# Patient Record
Sex: Male | Born: 1995
Health system: Southern US, Community
[De-identification: ages and names within clinical notes are randomized; demographics above are authoritative.]

## PROBLEM LIST (undated history)

## (undated) DIAGNOSIS — F419 Anxiety disorder, unspecified: Secondary | ICD-10-CM

## (undated) DIAGNOSIS — F988 Other specified behavioral and emotional disorders with onset usually occurring in childhood and adolescence: Secondary | ICD-10-CM

## (undated) DIAGNOSIS — T7840XA Allergy, unspecified, initial encounter: Secondary | ICD-10-CM

## (undated) DIAGNOSIS — E119 Type 2 diabetes mellitus without complications: Secondary | ICD-10-CM

## (undated) DIAGNOSIS — J45909 Unspecified asthma, uncomplicated: Secondary | ICD-10-CM

## (undated) DIAGNOSIS — K219 Gastro-esophageal reflux disease without esophagitis: Secondary | ICD-10-CM

## (undated) DIAGNOSIS — L709 Acne, unspecified: Secondary | ICD-10-CM

## (undated) DIAGNOSIS — G4730 Sleep apnea, unspecified: Secondary | ICD-10-CM

## (undated) DIAGNOSIS — L0591 Pilonidal cyst without abscess: Secondary | ICD-10-CM

## (undated) DIAGNOSIS — I10 Essential (primary) hypertension: Secondary | ICD-10-CM

## (undated) HISTORY — DX: Allergy, unspecified, initial encounter: T78.40XA

## (undated) HISTORY — DX: Other specified behavioral and emotional disorders with onset usually occurring in childhood and adolescence: F98.8

## (undated) HISTORY — DX: Gastro-esophageal reflux disease without esophagitis: K21.9

## (undated) HISTORY — PX: OTHER SURGICAL HISTORY: SHX169

## (undated) HISTORY — DX: Unspecified asthma, uncomplicated: J45.909

## (undated) HISTORY — DX: Essential (primary) hypertension: I10

---
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
## 2015-01-03 ENCOUNTER — Ambulatory Visit (HOSPITAL_BASED_OUTPATIENT_CLINIC_OR_DEPARTMENT_OTHER): Payer: 59 | Attending: General Surgery | Admitting: Radiology

## 2015-01-03 VITALS — Ht 67.0 in | Wt 330.0 lb

## 2015-01-03 DIAGNOSIS — I10 Essential (primary) hypertension: Secondary | ICD-10-CM | POA: Insufficient documentation

## 2015-01-03 DIAGNOSIS — Z6841 Body Mass Index (BMI) 40.0 and over, adult: Secondary | ICD-10-CM | POA: Insufficient documentation

## 2015-01-03 DIAGNOSIS — R5383 Other fatigue: Secondary | ICD-10-CM | POA: Diagnosis present

## 2015-01-03 DIAGNOSIS — G4733 Obstructive sleep apnea (adult) (pediatric): Secondary | ICD-10-CM | POA: Insufficient documentation

## 2015-01-03 DIAGNOSIS — R0683 Snoring: Secondary | ICD-10-CM

## 2015-01-07 DIAGNOSIS — R0683 Snoring: Secondary | ICD-10-CM | POA: Diagnosis not present

## 2015-01-07 NOTE — Progress Notes (Signed)
  Patient Name: Charles Morton, Charles Morton Study Date: 01/03/2015 Gender: Male D.O.B: 11-27-1995 Age (years): 19 Referring Provider: Gaynelle AduEric [name] Height (inches): 67 Interpreting Physician: Jetty Duhamellinton Silvina Hackleman MD, ABSM Weight (lbs): 330 RPSGT: Ulyess MortSpruill, Vicki BMI: 52 MRN: [id number] Neck Size: 18.00  CLINICAL INFORMATION Sleep Study Type: NPSG Indication for sleep study: Daytime Fatigue, Hypertension, Morbid Obesity, Snoring Epworth Sleepiness Score: 18  SLEEP STUDY TECHNIQUE As per the AASM Manual for the Scoring of Sleep and Associated Events v2.3 (April 2016) with a hypopnea requiring 4% desaturations. The channels recorded and monitored were frontal, central and occipital EEG, electrooculogram (EOG), submentalis EMG (chin), nasal and oral airflow, thoracic and abdominal wall motion, anterior tibialis EMG, snore microphone, electrocardiogram, and pulse oximetry.  MEDICATIONS Patient's medications include: charted for review. Medications self-administered by patient during sleep study : No sleep medicine administered.  SLEEP ARCHITECTURE The study was initiated at 9:53:23 PM and ended at 4:40:53 AM. Sleep onset time was 40.9 minutes and the sleep efficiency was 63.1%. The total sleep time was 257.0 minutes. Stage REM latency was 295.0 minutes. The patient spent 16.93% of the night in stage N1 sleep, 45.53% in stage N2 sleep, 27.04% in stage N3 and 10.51% in REM. Alpha intrusion was absent. Supine sleep was 85.03%. Wake after sleep onset 109.6 minutes  RESPIRATORY PARAMETERS The overall apnea/hypopnea index (AHI) was 5.1 per hour. There were 1 total apneas, including 1 obstructive, 0 central and 0 mixed apneas. There were 21 hypopneas and 13 RERAs. The AHI during Stage REM sleep was 37.8 per hour. AHI while supine was 1.4 per hour. The mean oxygen saturation was 95.10%. The minimum SpO2 during sleep was 82.00%. Moderate snoring was noted during this study.  CARDIAC DATA The 2 lead EKG  demonstrated sinus rhythm. The mean heart rate was 77.21 beats per minute. Other EKG findings include: None.  LEG MOVEMENT DATA The total PLMS were 0 with a resulting PLMS index of 0.00. Associated arousal with leg movement index was 0.0 . IMPRESSIONS - Mild obstructive sleep apnea occurred during this study (AHI = 5.1/h). - No significant central sleep apnea occurred during this study (CAI = 0.0/h). - Mild oxygen desaturation was noted during this study (Min O2 = 82.00%). - The patient snored with Moderate snoring volume. - No cardiac abnormalities were noted during this study. - Clinically significant periodic limb movements did not occur during sleep. No significant associated arousals.  DIAGNOSIS - Obstructive Sleep Apnea (327.23 [G47.33 ICD-10])  RECOMMENDATIONS - Very mild obstructive sleep apnea. Return to discuss treatment options. - Avoid alcohol, sedatives and other CNS depressants that may worsen sleep apnea and disrupt normal sleep architecture. - Sleep hygiene should be reviewed to assess factors that may improve sleep quality. - Weight management and regular exercise should be initiated or continued if appropriate.  Waymon BudgeYOUNG,Charles Morton D Diplomate, American Board of Sleep Medicine  ELECTRONICALLY SIGNED ON:  01/07/2015, 12:07 PM [facility] PH: [phone]   FX: [phone] ACCREDITED BY THE AMERICAN ACADEMY OF SLEEP MEDICINE

## 2015-01-09 ENCOUNTER — Encounter: Payer: 59 | Attending: General Surgery

## 2015-01-09 DIAGNOSIS — Z713 Dietary counseling and surveillance: Secondary | ICD-10-CM | POA: Insufficient documentation

## 2015-01-09 DIAGNOSIS — Z6841 Body Mass Index (BMI) 40.0 and over, adult: Secondary | ICD-10-CM | POA: Diagnosis not present

## 2015-01-10 NOTE — Progress Notes (Signed)
  Pre-Operative Nutrition Class:  Appt start time: 4585   End time:  1830.  Patient was seen on 01/09/15 for Pre-Operative Bariatric Surgery Education at [facility].   Surgery date: 01/24/15 Surgery type: Sleeve gastrectomy [facility] weight at [facility]: 318 lbs on 10/08/14 Weight today: 333.5 lbs  TANITA  BODY COMP RESULTS  01/09/15   BMI (kg/m^2) 49.3   Fat Mass (lbs) 206   Fat Free Mass (lbs) 127.5   Total Body Water (lbs) 93.5   Samples given per MNT protocol. Patient educated on appropriate usage: Premier protein shake (vanilla - qty 1) Lot #: [id number] Exp: 12/2015  Unjury protein powder (unflavored - qty 1) Lot #: [id number] Exp: 09/2015  Celebrate Vitamins Multivitamin (orange - qty 1) Lot #: [id number] Exp: 05/2016  Celebrate Vitamins Calcium Citrate (berry - qty 1) Lot #: [id number] Exp: 05/2016  PB2 (chocolate - qty 1) Lot #: N/A Exp: 01/14/15  The following the learning objectives were met by the patient during this course:  Identify Pre-Op Dietary Goals and will begin 2 weeks pre-operatively  Identify appropriate sources of fluids and proteins   State protein recommendations and appropriate sources pre and post-operatively  Identify Post-Operative Dietary Goals and will follow for 2 weeks post-operatively  Identify appropriate multivitamin and calcium sources  Describe the need for physical activity post-operatively and will follow MD recommendations  State when to call healthcare provider regarding medication questions or post-operative complications  Handouts given during class include:  Pre-Op Bariatric Surgery Diet Handout  Protein Shake Handout  Post-Op Bariatric Surgery Nutrition Handout  [facility] Program Information Flyer  Support Group Information Flyer  [facility] Bariatric Supplements Price List  Follow-Up Plan: Patient will follow-up at [facility] 2 weeks post operatively for diet advancement per MD.

## 2015-01-12 ENCOUNTER — Ambulatory Visit: Payer: Self-pay | Admitting: General Surgery

## 2015-01-12 NOTE — H&P (Signed)
Charles Morton [birth year]  03/13/2014 4:07 PM Location: [facility] Patient #: [id number] DOB: [birth year] Single / Language: Charles Morton / Race: White Male  History of Present Illness Charles Morton M. Charles Likes MD; 01/12/2015 5:15 PM) Patient words: bariatric.  The patient is a 19 year old male who presents for a pre-op visit. He comes in today for his preoperative visit. He has been approved for laparoscopic sleeve gastrectomy with upper endoscopy. I initially met him on June 16. His weight at that time was 302 pounds. His weight today is 327 pounds. He started his preoperative diet last week. He states that he weighed 335 pounds when he started his preoperative diet. He is accompanied by his mother. He denies any changes to his medical history since we initially met. He denies any fever, chills, chest pain, shortness of breath, dyspnea on exertion, orthopnea, paroxysmal nocturnal dyspnea, abdominal pain, diarrhea or constipation. He states that his reflux is actually much improved.  His preoperative workup showed that he had a diffuse fatty liver on abdominal ultrasound without gallstones. His upper GI was unremarkable. His evaluation labs were unremarkable except for hemoglobin A1c level of 5.8. His LFTs were normal when we checked him. He has been seen by nutrition and psychology and cleared for surgery.   Problem List/Past Medical Charles Morton Charles Derby, MD; 01/12/2015 5:15 PM) MILD OBSTRUCTIVE SLEEP APNEA (G47.33) PREDIABETES (R73.03) ELEVATED BLOOD PRESSURE (I10) FATTY LIVER DISEASE, NONALCOHOLIC (N00.3) SNORING (R06.83) PILONIDAL ABSCESS (L05.01) OBESITY, MORBID, BMI 40.0-49.9 (E66.01) GASTROESOPHAGEAL REFLUX DISEASE, ESOPHAGITIS PRESENCE NOT SPECIFIED (K21.9)  Other Problems Charles Curry, MD; 01/12/2015 5:15 PM) Other disease, cancer, significant illness  Diagnostic Studies History Charles Curry, MD; 01/12/2015 5:15 PM) Colonoscopy never  Allergies ([facility]; 01/12/2015 4:08 PM) BuSpar *ANTIANXIETY AGENTS* Depakote *ANTICONVULSANTS* Intuniv *ADHD/ANTI-NARCOLEPSY/ANTI-OBESITY/ANOREXIANTS*  Medication History Charles Curry, MD; 01/12/2015 5:15 PM) PROzac (20MG  Capsule, Oral) Active. CloNIDine HCl (0.2MG  Tablet, Oral) Active. Medications Reconciled OxyCODONE HCl (5MG /5ML Solution, 5-10 Milliliter Oral every four hours, as needed, Taken starting 01/12/2015) Active. Protonix (20MG  Tablet DR, 1 (one) Tablet DR Oral daily, Taken starting 01/12/2015) Active. Zofran ODT (4MG  Tablet Disperse, 1 (one) Tablet Disperse Oral every four hours, as needed, Taken starting 01/12/2015) Active. ZyrTEC Childrens Allergy (10MG  Tablet Chewable, Oral) Active. Adderall XR (30MG  Capsule ER 24HR, Oral) Active.  Social History Charles Curry, MD; 01/12/2015 5:15 PM) No drug use No alcohol use Tobacco use Never smoker. Caffeine use Tea.  Family History Charles Curry, MD; 01/12/2015 5:15 PM) Arthritis Mother. Migraine Headache Mother.     Review of Systems Charles Morton M. Charles Delair MD; 01/12/2015 5:13 PM) General Present- Weight Gain. Not Present- Appetite Loss, Chills, Fatigue, Fever, Night Sweats and Weight Loss. Skin Not Present- Change in Wart/Mole, Dryness, Hives, Jaundice, New Lesions, Non-Healing Wounds, Rash and Ulcer. HEENT Not Present- Earache, Hearing Loss, Hoarseness, Nose Bleed, Oral Ulcers, Ringing in the Ears, Seasonal Allergies, Sinus Pain, Sore Throat, Visual Disturbances, Wears glasses/contact lenses and Yellow Eyes. Respiratory Not Present- Bloody sputum, Chronic Cough, Difficulty Breathing, Snoring and Wheezing. Breast Not Present- Breast Mass, Breast Pain, Nipple Discharge and Skin Changes. Cardiovascular Not Present- Chest Pain, Difficulty Breathing Lying Down, Leg Cramps, Palpitations, Rapid Heart Rate, Shortness of Breath and Swelling of Extremities. Gastrointestinal Not Present- Abdominal Pain, Bloating, Bloody Stool, Change in  Bowel Habits, Chronic diarrhea, Constipation, Difficulty Swallowing, Excessive gas, Gets full quickly at meals, Hemorrhoids, Indigestion, Nausea, Rectal Pain and Vomiting. Male Genitourinary Not Present- Blood in Urine, Change in Urinary Stream, Frequency, Impotence,  Nocturia, Painful Urination, Urgency and Urine Leakage. Neurological Not Present- Decreased Memory, Fainting, Headaches, Numbness, Seizures, Tingling, Tremor, Trouble walking and Weakness. Psychiatric Present- Anxiety. Not Present- Bipolar, Change in Sleep Pattern, Depression, Fearful and Frequent crying. Endocrine Not Present- Cold Intolerance, Excessive Hunger, Hair Changes, Heat Intolerance and New Diabetes. Hematology Not Present- Easy Bruising, Excessive bleeding, Gland problems, HIV and Persistent Infections.  Vitals (Charles Morton CMA; 01/12/2015 4:08 PM) 01/12/2015 4:08 PM Weight: 327 lb (Above 99th percentile) Height: 69in (42nd percentile) Body Surface Area: 2.55 m Body Mass Index: 48.29 kg/m  (Above 99th percentile)  Temp.: 59F(Temporal)  Pulse: 106 (Regular)  BP: 122/78 (Sitting, Left Arm, Standard)  Percentiles calculated using CDC data for children 2-20 years.    Physical Exam Charles Morton M. Charles Schermerhorn MD; 01/12/2015 5:13 PM)  General Mental Status-Alert. General Appearance-Consistent with stated age. Hydration-Well hydrated. Voice-Normal. Note: morbidly obese  Head and Neck Head-normocephalic, atraumatic with no lesions or palpable masses. Trachea-midline. Thyroid Gland Characteristics - normal size and consistency.  Eye Eyeball - Bilateral-Extraocular movements intact. Sclera/Conjunctiva - Bilateral-No scleral icterus.  Chest and Lung Exam Chest and lung exam reveals -quiet, even and easy respiratory effort with no use of accessory muscles and on auscultation, normal breath sounds, no adventitious sounds and normal vocal resonance. Inspection Chest Wall - Normal. Back -  normal.  Breast - Did not examine.  Cardiovascular Cardiovascular examination reveals -normal heart sounds, regular rate and rhythm with no murmurs and normal pedal pulses bilaterally.  Abdomen Inspection Inspection of the abdomen reveals - No Hernias. Skin - Scar - no surgical scars. Palpation/Percussion Palpation and Percussion of the abdomen reveal - Soft, Non Tender, No Rebound tenderness, No Rigidity (guarding) and No hepatosplenomegaly. Auscultation Auscultation of the abdomen reveals - Bowel sounds normal.  Peripheral Vascular Upper Extremity Palpation - Pulses bilaterally normal.  Neurologic Neurologic evaluation reveals -alert and oriented x 3 with no impairment of recent or remote memory. Mental Status-Normal.  Neuropsychiatric The patient's mood and affect are described as -normal. Judgment and Insight-insight is appropriate concerning matters relevant to self.  Musculoskeletal Normal Exam - Left-Upper Extremity Strength Normal and Lower Extremity Strength Normal. Normal Exam - Right-Upper Extremity Strength Normal and Lower Extremity Strength Normal.  Lymphatic Head & Neck  General Head & Neck Lymphatics: Bilateral - Description - Normal. Axillary - Did not examine. Femoral & Inguinal - Did not examine.    Assessment & Plan Charles Morton M. Jovon Streetman MD; 01/12/2015 5:13 PM)  OBESITY, MORBID, BMI 40.0-49.9 (E66.01) Impression: We reviewed his preoperative workup. He is accompanied by his mother. We discussed the results of all his test and imaging and labs. We discussed the importance of the preoperative diet. We rediscussed the typical postoperative path. We also discussed some of the important postoperative complications. All of his questions were asked and answered. He was given prescriptions today for postoperative pain, nausea and heartburn.  Current Plans Pt Education - EMW_preopbariatric ELEVATED BLOOD PRESSURE (I10) Impression: His blood pressure  is much improved today.  GASTROESOPHAGEAL REFLUX DISEASE, ESOPHAGITIS PRESENCE NOT SPECIFIED (K21.9)  FATTY LIVER DISEASE, NONALCOHOLIC (J24.2) Impression: We discussed the finding of fatty liver disease on his abdominal ultrasound. We discussed the most important thing for this would be weight loss. I did explain that we will need to keep an eye on this long-term.  PREDIABETES (R73.03) Impression: We discussed the finding of him having a hemoglobin A1c of 5.8. The specimen the prediabetic range. We discussed the importance of good food choices as well as weight loss.  MILD OBSTRUCTIVE SLEEP APNEA (G47.33) Impression: His sleep study showed mild obstructive sleep apnea. The pulmonologist recommended either weight loss or consideration of CPAP. Since his sleep apnea was mild we will hold off on CPAP for now  Leighton Ruff. Redmond Pulling, MD, FACS General, Bariatric, & Minimally Invasive Surgery [facility], [place]

## 2015-01-16 NOTE — Patient Instructions (Signed)
Charles Morton  01/16/2015   Your procedure is scheduled on:   01/24/2015    [facility]  Entrance [place]  elevators to 3rd floor to  Short Stay Center at     12noon .  Call this number if you have problems the morning of surgery [phone]   Remember: ONLY 1 PERSON MAY GO WITH YOU TO SHORT STAY TO GET  READY MORNING OF YOUR SURGERY.  Do not eat food after midnite.  May have clear liquids from 12 midnite until 0730am then nothing by mouth.       Take these medicines the morning of surgery with A SIP OF WATER:   Zyrtec, Clonidine ( Catapres), prozac                                 You may not have any metal on your body including hair pins and              piercings  Do not wear jewelry, lotions, powders or perfumes, deodorant                         Men may shave face and neck.   Do not bring valuables to the hospital. [facility] IS NOT             RESPONSIBLE   FOR VALUABLES.  Contacts, dentures or bridgework may not be worn into surgery.  Leave suitcase in the car. After surgery it may be brought to your room.       Special Instructions: coughing and deep breathing exercises, leg exercises               Please read over the following fact sheets you were given: _____________________________________________________________________                CLEAR LIQUID DIET   Foods Allowed                                                                     Foods Excluded  Coffee and tea, regular and decaf                             liquids that you cannot  Plain Jell-O in any flavor                                             see through such as: Fruit ices (not with fruit pulp)                                     milk, soups, orange juice  Iced Popsicles  All solid food Carbonated beverages, regular and diet                                    Cranberry, grape and apple juices Sports drinks like  Gatorade Lightly seasoned clear broth or consume(fat free) Sugar, honey syrup  Sample Menu Breakfast                                Lunch                                     Supper Cranberry juice                    Beef broth                            Chicken broth Jell-O                                     Grape juice                           Apple juice Coffee or tea                        Jell-O                                      Popsicle                                                Coffee or tea                        Coffee or tea  _____________________________________________________________________  [facility] - Preparing for Surgery Before surgery, you can play an important role.  Because skin is not sterile, your skin needs to be as free of germs as possible.  You can reduce the number of germs on your skin by washing with CHG (chlorahexidine gluconate) soap before surgery.  CHG is an antiseptic cleaner which kills germs and bonds with the skin to continue killing germs even after washing. Please DO NOT use if you have an allergy to CHG or antibacterial soaps.  If your skin becomes reddened/irritated stop using the CHG and inform your nurse when you arrive at Short Stay. Do not shave (including legs and underarms) for at least 48 hours prior to the first CHG shower.  You may shave your face/neck. Please follow these instructions carefully:  1.  Shower with CHG Soap the night before surgery and the  morning of Surgery.  2.  If you choose to wash your hair, wash your hair first as usual with your  normal  shampoo.  3.  After you shampoo, rinse your hair and body thoroughly to remove the  shampoo.  4.  Use CHG as you would any other liquid soap.  You can apply chg directly  to the skin and wash                       Gently with a scrungie or clean washcloth.  5.  Apply the CHG Soap to your body ONLY FROM THE NECK DOWN.   Do not use on face/ open                            Wound or open sores. Avoid contact with eyes, ears mouth and genitals (private parts).                       Wash face,  Genitals (private parts) with your normal soap.             6.  Wash thoroughly, paying special attention to the area where your surgery  will be performed.  7.  Thoroughly rinse your body with warm water from the neck down.  8.  DO NOT shower/wash with your normal soap after using and rinsing off  the CHG Soap.                9.  Pat yourself dry with a clean towel.            10.  Wear clean pajamas.            11.  Place clean sheets on your bed the night of your first shower and do not  sleep with pets. Day of Surgery : Do not apply any lotions/deodorants the morning of surgery.  Please wear clean clothes to the hospital/surgery center.  FAILURE TO FOLLOW THESE INSTRUCTIONS MAY RESULT IN THE CANCELLATION OF YOUR SURGERY PATIENT SIGNATURE_________________________________  NURSE SIGNATURE__________________________________  ________________________________________________________________________

## 2015-01-17 ENCOUNTER — Encounter (HOSPITAL_COMMUNITY)
Admission: RE | Admit: 2015-01-17 | Discharge: 2015-01-17 | Disposition: A | Discharge status: Home / Self Care | Payer: 59 | Source: Ambulatory Visit | Attending: General Surgery | Admitting: General Surgery

## 2015-01-17 ENCOUNTER — Encounter (HOSPITAL_COMMUNITY): Payer: Self-pay

## 2015-01-17 DIAGNOSIS — Z01812 Encounter for preprocedural laboratory examination: Secondary | ICD-10-CM | POA: Insufficient documentation

## 2015-01-17 HISTORY — DX: Type 2 diabetes mellitus without complications: E11.9

## 2015-01-17 HISTORY — DX: Sleep apnea, unspecified: G47.30

## 2015-01-17 HISTORY — DX: Anxiety disorder, unspecified: F41.9

## 2015-01-17 LAB — CBC WITH DIFFERENTIAL/PLATELET
BASOS ABS: 0 10*3/uL (ref 0.0–0.1)
Basophils Relative: 0 %
EOS PCT: 3 %
Eosinophils Absolute: 0.2 10*3/uL (ref 0.0–0.7)
HCT: 42.7 % (ref 39.0–52.0)
Hemoglobin: 14.2 g/dL (ref 13.0–17.0)
LYMPHS PCT: 30 %
Lymphs Abs: 2.8 10*3/uL (ref 0.7–4.0)
MCH: 30.1 pg (ref 26.0–34.0)
MCHC: 33.3 g/dL (ref 30.0–36.0)
MCV: 90.5 fL (ref 78.0–100.0)
MONO ABS: 0.7 10*3/uL (ref 0.1–1.0)
MONOS PCT: 8 %
Neutro Abs: 5.7 10*3/uL (ref 1.7–7.7)
Neutrophils Relative %: 59 %
PLATELETS: 308 10*3/uL (ref 150–400)
RBC: 4.72 MIL/uL (ref 4.22–5.81)
RDW: 12.4 % (ref 11.5–15.5)
WBC: 9.5 10*3/uL (ref 4.0–10.5)

## 2015-01-17 LAB — COMPREHENSIVE METABOLIC PANEL
ALT: 87 U/L — ABNORMAL HIGH (ref 17–63)
ANION GAP: 10 (ref 5–15)
AST: 53 U/L — AB (ref 15–41)
Albumin: 4.9 g/dL (ref 3.5–5.0)
Alkaline Phosphatase: 55 U/L (ref 38–126)
BUN: 24 mg/dL — AB (ref 6–20)
CHLORIDE: 104 mmol/L (ref 101–111)
CO2: 26 mmol/L (ref 22–32)
Calcium: 9.7 mg/dL (ref 8.9–10.3)
Creatinine, Ser: 1.25 mg/dL — ABNORMAL HIGH (ref 0.61–1.24)
Glucose, Bld: 102 mg/dL — ABNORMAL HIGH (ref 65–99)
POTASSIUM: 4 mmol/L (ref 3.5–5.1)
Sodium: 140 mmol/L (ref 135–145)
TOTAL PROTEIN: 8 g/dL (ref 6.5–8.1)
Total Bilirubin: 0.4 mg/dL (ref 0.3–1.2)

## 2015-01-17 NOTE — Progress Notes (Signed)
CMP results done 01/17/15 faxed via EPIC to Dr Gaynelle AduEric [name] .

## 2015-01-17 NOTE — Progress Notes (Signed)
EKG-09/27/14- EPIC  CXR- 09/27/14-EPIC  Sleep Study- 01/2015-EPIC

## 2015-01-23 ENCOUNTER — Ambulatory Visit (HOSPITAL_BASED_OUTPATIENT_CLINIC_OR_DEPARTMENT_OTHER): Payer: 59

## 2015-01-24 ENCOUNTER — Encounter (HOSPITAL_COMMUNITY)
Admission: RE | Disposition: A | Discharge status: Home / Self Care | Payer: Self-pay | Source: Ambulatory Visit | Attending: General Surgery

## 2015-01-24 ENCOUNTER — Inpatient Hospital Stay (HOSPITAL_COMMUNITY): Payer: 59 | Admitting: Anesthesiology

## 2015-01-24 ENCOUNTER — Encounter (HOSPITAL_COMMUNITY): Payer: Self-pay | Admitting: *Deleted

## 2015-01-24 ENCOUNTER — Inpatient Hospital Stay (HOSPITAL_COMMUNITY)
Admission: RE | Admit: 2015-01-24 | Discharge: 2015-01-26 | DRG: 621 | Disposition: A | Discharge status: Home / Self Care | Payer: 59 | Source: Ambulatory Visit | Attending: General Surgery | Admitting: General Surgery

## 2015-01-24 DIAGNOSIS — R030 Elevated blood-pressure reading, without diagnosis of hypertension: Secondary | ICD-10-CM

## 2015-01-24 DIAGNOSIS — G4733 Obstructive sleep apnea (adult) (pediatric): Secondary | ICD-10-CM | POA: Diagnosis present

## 2015-01-24 DIAGNOSIS — K210 Gastro-esophageal reflux disease with esophagitis: Secondary | ICD-10-CM | POA: Diagnosis present

## 2015-01-24 DIAGNOSIS — R7303 Prediabetes: Secondary | ICD-10-CM | POA: Diagnosis present

## 2015-01-24 DIAGNOSIS — R110 Nausea: Secondary | ICD-10-CM

## 2015-01-24 DIAGNOSIS — K760 Fatty (change of) liver, not elsewhere classified: Secondary | ICD-10-CM | POA: Diagnosis present

## 2015-01-24 DIAGNOSIS — Z6841 Body Mass Index (BMI) 40.0 and over, adult: Secondary | ICD-10-CM

## 2015-01-24 DIAGNOSIS — Z01812 Encounter for preprocedural laboratory examination: Secondary | ICD-10-CM | POA: Diagnosis not present

## 2015-01-24 DIAGNOSIS — Z79899 Other long term (current) drug therapy: Secondary | ICD-10-CM

## 2015-01-24 DIAGNOSIS — I10 Essential (primary) hypertension: Secondary | ICD-10-CM | POA: Diagnosis present

## 2015-01-24 DIAGNOSIS — IMO0001 Reserved for inherently not codable concepts without codable children: Secondary | ICD-10-CM | POA: Diagnosis present

## 2015-01-24 HISTORY — PX: UPPER GI ENDOSCOPY: SHX6162

## 2015-01-24 HISTORY — PX: LAPAROSCOPIC GASTRIC SLEEVE RESECTION: SHX5895

## 2015-01-24 LAB — HEMOGLOBIN AND HEMATOCRIT, BLOOD
HCT: 42 % (ref 39.0–52.0)
Hemoglobin: 14.1 g/dL (ref 13.0–17.0)

## 2015-01-24 LAB — GLUCOSE, CAPILLARY
GLUCOSE-CAPILLARY: 135 mg/dL — AB (ref 65–99)
Glucose-Capillary: 92 mg/dL (ref 65–99)

## 2015-01-24 LAB — CREATININE, SERUM
Creatinine, Ser: 1.21 mg/dL (ref 0.61–1.24)
GFR calc non Af Amer: >60 mL/min (ref >60)

## 2015-01-24 SURGERY — GASTRECTOMY, SLEEVE, LAPAROSCOPIC
Anesthesia: General

## 2015-01-24 MED ORDER — HYDROMORPHONE HCL 1 MG/ML IJ SOLN
INTRAMUSCULAR | Status: DC | PRN
  Administered 2015-01-24 (×2): 1 mg via INTRAVENOUS

## 2015-01-24 MED ORDER — DIPHENHYDRAMINE HCL 50 MG/ML IJ SOLN: 12.5000 mg | Freq: Three times a day (TID) | INTRAMUSCULAR | Status: DC | PRN

## 2015-01-24 MED ORDER — PANTOPRAZOLE SODIUM 40 MG IV SOLR
40.0000 mg | Freq: Every day | INTRAVENOUS | Status: DC
  Administered 2015-01-24 – 2015-01-25 (×2): 40 mg via INTRAVENOUS
  Filled 2015-01-24 (×3): qty 40

## 2015-01-24 MED ORDER — CEFOTETAN DISODIUM-DEXTROSE 2-2.08 GM-% IV SOLR
INTRAVENOUS | Status: AC
  Filled 2015-01-24: qty 50

## 2015-01-24 MED ORDER — INSULIN ASPART 100 UNIT/ML ~~LOC~~ SOLN: 0.0000 [IU] | SUBCUTANEOUS | Status: DC

## 2015-01-24 MED ORDER — PROPOFOL 10 MG/ML IV BOLUS
INTRAVENOUS | Status: AC
  Filled 2015-01-24: qty 20

## 2015-01-24 MED ORDER — ONDANSETRON HCL 4 MG/2ML IJ SOLN
INTRAMUSCULAR | Status: DC | PRN
  Administered 2015-01-24: 4 mg via INTRAVENOUS

## 2015-01-24 MED ORDER — HYDROMORPHONE HCL 2 MG/ML IJ SOLN
INTRAMUSCULAR | Status: AC
  Filled 2015-01-24: qty 1

## 2015-01-24 MED ORDER — ONDANSETRON HCL 4 MG/2ML IJ SOLN
INTRAMUSCULAR | Status: AC
  Filled 2015-01-24: qty 2

## 2015-01-24 MED ORDER — POTASSIUM CHLORIDE IN NACL 20-0.45 MEQ/L-% IV SOLN
INTRAVENOUS | Status: DC
Start: 2015-01-24 — End: 2015-01-26
  Administered 2015-01-24 – 2015-01-25 (×3): 125 mL/h via INTRAVENOUS
  Administered 2015-01-25: 16:00:00 via INTRAVENOUS
  Administered 2015-01-26: 125 mL/h via INTRAVENOUS
  Filled 2015-01-24 (×10): qty 1000

## 2015-01-24 MED ORDER — OXYCODONE HCL 5 MG/5ML PO SOLN: 5.0000 mg | ORAL | Status: DC | PRN

## 2015-01-24 MED ORDER — MORPHINE SULFATE (PF) 2 MG/ML IV SOLN: 2.0000 mg | INTRAVENOUS | Status: DC | PRN

## 2015-01-24 MED ORDER — CEFOTETAN DISODIUM-DEXTROSE 2-2.08 GM-% IV SOLR
2.0000 g | INTRAVENOUS | Status: AC
  Administered 2015-01-24: 2 g via INTRAVENOUS

## 2015-01-24 MED ORDER — MIDAZOLAM HCL 5 MG/5ML IJ SOLN
INTRAMUSCULAR | Status: DC | PRN
  Administered 2015-01-24: 2 mg via INTRAVENOUS

## 2015-01-24 MED ORDER — LIDOCAINE HCL (CARDIAC) 20 MG/ML IV SOLN
INTRAVENOUS | Status: AC
  Filled 2015-01-24: qty 5

## 2015-01-24 MED ORDER — PROMETHAZINE HCL 25 MG/ML IJ SOLN
12.5000 mg | Freq: Four times a day (QID) | INTRAMUSCULAR | Status: DC | PRN
  Administered 2015-01-24 – 2015-01-25 (×2): 12.5 mg via INTRAVENOUS
  Filled 2015-01-24 (×2): qty 1

## 2015-01-24 MED ORDER — PREMIER PROTEIN SHAKE
2.0000 [oz_av] | Freq: Four times a day (QID) | ORAL | Status: DC
  Administered 2015-01-26 (×2): 2 [oz_av] via ORAL

## 2015-01-24 MED ORDER — PHENYLEPHRINE HCL 10 MG/ML IJ SOLN
INTRAMUSCULAR | Status: DC | PRN
  Administered 2015-01-24 (×4): 80 ug via INTRAVENOUS

## 2015-01-24 MED ORDER — SUFENTANIL CITRATE 50 MCG/ML IV SOLN
INTRAVENOUS | Status: AC
  Filled 2015-01-24: qty 1

## 2015-01-24 MED ORDER — SODIUM CHLORIDE 0.9 % IJ SOLN
INTRAMUSCULAR | Status: AC
  Filled 2015-01-24: qty 50

## 2015-01-24 MED ORDER — MEPERIDINE HCL 50 MG/ML IJ SOLN
6.2500 mg | INTRAMUSCULAR | Status: DC | PRN
Start: 2015-01-24 — End: 2015-01-24

## 2015-01-24 MED ORDER — PROPOFOL 10 MG/ML IV BOLUS
INTRAVENOUS | Status: DC | PRN
  Administered 2015-01-24: 250 mg via INTRAVENOUS

## 2015-01-24 MED ORDER — HYDROMORPHONE HCL 1 MG/ML IJ SOLN: 0.2500 mg | INTRAMUSCULAR | Status: DC | PRN

## 2015-01-24 MED ORDER — SUGAMMADEX SODIUM 500 MG/5ML IV SOLN
INTRAVENOUS | Status: DC | PRN
  Administered 2015-01-24: 300 mg via INTRAVENOUS

## 2015-01-24 MED ORDER — ROCURONIUM BROMIDE 100 MG/10ML IV SOLN
INTRAVENOUS | Status: AC
  Filled 2015-01-24: qty 1

## 2015-01-24 MED ORDER — LACTATED RINGERS IR SOLN
Status: DC | PRN
  Administered 2015-01-24: 1000 mL

## 2015-01-24 MED ORDER — ENOXAPARIN SODIUM 30 MG/0.3ML ~~LOC~~ SOLN
30.0000 mg | Freq: Two times a day (BID) | SUBCUTANEOUS | Status: DC
  Administered 2015-01-25 – 2015-01-26 (×3): 30 mg via SUBCUTANEOUS
  Filled 2015-01-24 (×5): qty 0.3

## 2015-01-24 MED ORDER — SUGAMMADEX SODIUM 500 MG/5ML IV SOLN
INTRAVENOUS | Status: AC
  Filled 2015-01-24: qty 5

## 2015-01-24 MED ORDER — METOCLOPRAMIDE HCL 5 MG/ML IJ SOLN
10.0000 mg | Freq: Three times a day (TID) | INTRAMUSCULAR | Status: DC | PRN
  Administered 2015-01-25: 10 mg via INTRAVENOUS
  Filled 2015-01-24: qty 2

## 2015-01-24 MED ORDER — HEPARIN SODIUM (PORCINE) 5000 UNIT/ML IJ SOLN
5000.0000 [IU] | INTRAMUSCULAR | Status: AC
  Administered 2015-01-24: 5000 [IU] via SUBCUTANEOUS
  Filled 2015-01-24: qty 1

## 2015-01-24 MED ORDER — ACETAMINOPHEN 160 MG/5ML PO SOLN: 650.0000 mg | ORAL | Status: DC | PRN

## 2015-01-24 MED ORDER — SODIUM CHLORIDE 0.9 % IJ SOLN
INTRAMUSCULAR | Status: DC | PRN
  Administered 2015-01-24: 40 mL via INTRAVENOUS

## 2015-01-24 MED ORDER — FENTANYL CITRATE (PF) 100 MCG/2ML IJ SOLN: INTRAMUSCULAR | Status: DC | PRN

## 2015-01-24 MED ORDER — DEXAMETHASONE SODIUM PHOSPHATE 10 MG/ML IJ SOLN
INTRAMUSCULAR | Status: DC | PRN
  Administered 2015-01-24: 10 mg via INTRAVENOUS

## 2015-01-24 MED ORDER — ACETAMINOPHEN 160 MG/5ML PO SOLN: 325.0000 mg | ORAL | Status: DC | PRN

## 2015-01-24 MED ORDER — STERILE WATER FOR IRRIGATION IR SOLN
Status: DC | PRN
  Administered 2015-01-24: 2000 mL

## 2015-01-24 MED ORDER — DEXAMETHASONE SODIUM PHOSPHATE 10 MG/ML IJ SOLN
INTRAMUSCULAR | Status: AC
  Filled 2015-01-24: qty 1

## 2015-01-24 MED ORDER — SUFENTANIL CITRATE 50 MCG/ML IV SOLN
INTRAVENOUS | Status: DC | PRN
  Administered 2015-01-24 (×2): 10 ug via INTRAVENOUS
  Administered 2015-01-24: 30 ug via INTRAVENOUS

## 2015-01-24 MED ORDER — LACTATED RINGERS IV SOLN
INTRAVENOUS | Status: DC
  Administered 2015-01-24 (×3): via INTRAVENOUS
  Administered 2015-01-24: 1000 mL via INTRAVENOUS

## 2015-01-24 MED ORDER — 0.9 % SODIUM CHLORIDE (POUR BTL) OPTIME
TOPICAL | Status: DC | PRN
  Administered 2015-01-24: 1000 mL

## 2015-01-24 MED ORDER — ONDANSETRON HCL 4 MG/2ML IJ SOLN
4.0000 mg | Freq: Once | INTRAMUSCULAR | Status: AC | PRN
  Administered 2015-01-24: 4 mg via INTRAVENOUS

## 2015-01-24 MED ORDER — SODIUM CHLORIDE 0.9 % IJ SOLN
INTRAMUSCULAR | Status: AC
  Filled 2015-01-24: qty 10

## 2015-01-24 MED ORDER — ROCURONIUM BROMIDE 100 MG/10ML IV SOLN
INTRAVENOUS | Status: DC | PRN
  Administered 2015-01-24 (×2): 10 mg via INTRAVENOUS
  Administered 2015-01-24: 80 mg via INTRAVENOUS
  Administered 2015-01-24: 20 mg via INTRAVENOUS

## 2015-01-24 MED ORDER — BUPIVACAINE LIPOSOME 1.3 % IJ SUSP
20.0000 mL | Freq: Once | INTRAMUSCULAR | Status: AC
  Administered 2015-01-24: 20 mL
  Filled 2015-01-24: qty 20

## 2015-01-24 MED ORDER — LIDOCAINE HCL (CARDIAC) 20 MG/ML IV SOLN
INTRAVENOUS | Status: DC | PRN
  Administered 2015-01-24: 25 mg via INTRATRACHEAL
  Administered 2015-01-24: 75 mg via INTRAVENOUS

## 2015-01-24 MED ORDER — MIDAZOLAM HCL 2 MG/2ML IJ SOLN
INTRAMUSCULAR | Status: AC
  Filled 2015-01-24: qty 2

## 2015-01-24 MED ORDER — ONDANSETRON HCL 4 MG/2ML IJ SOLN
4.0000 mg | INTRAMUSCULAR | Status: DC | PRN
  Administered 2015-01-25 (×2): 4 mg via INTRAVENOUS
  Filled 2015-01-24 (×2): qty 2

## 2015-01-24 MED ORDER — METHOCARBAMOL 1000 MG/10ML IJ SOLN
500.0000 mg | Freq: Three times a day (TID) | INTRAMUSCULAR | Status: DC
  Administered 2015-01-24 – 2015-01-25 (×4): 500 mg via INTRAVENOUS
  Filled 2015-01-24 (×10): qty 5

## 2015-01-24 SURGICAL SUPPLY — 64 items
ADH SKN CLS APL DERMABOND .7 (GAUZE/BANDAGES/DRESSINGS) ×1
APPLICATOR COTTON TIP 6IN STRL (MISCELLANEOUS) IMPLANT
APPLIER CLIP ROT 10 11.4 M/L (STAPLE)
APR CLP MED LRG 11.4X10 (STAPLE)
BLADE SURG SZ11 CARB STEEL (BLADE) ×3 IMPLANT
CABLE HIGH FREQUENCY MONO STRZ (ELECTRODE) IMPLANT
CHLORAPREP W/TINT 26ML (MISCELLANEOUS) ×6 IMPLANT
CLIP APPLIE ROT 10 11.4 M/L (STAPLE) IMPLANT
COVER SURGICAL LIGHT HANDLE (MISCELLANEOUS) ×3 IMPLANT
DERMABOND ADVANCED (GAUZE/BANDAGES/DRESSINGS) ×1
DERMABOND ADVANCED .7 DNX12 (GAUZE/BANDAGES/DRESSINGS) ×2 IMPLANT
DEVICE PMI PUNCTURE CLOSURE (MISCELLANEOUS) IMPLANT
DEVICE SUT QUICK LOAD TK 5 (STAPLE) IMPLANT
DEVICE SUT TI-KNOT TK 5X26 (MISCELLANEOUS) IMPLANT
DEVICE SUTURE ENDOST 10MM (ENDOMECHANICALS) IMPLANT
DRAPE CAMERA CLOSED 9X96 (DRAPES) ×3 IMPLANT
DRAPE UTILITY XL STRL (DRAPES) ×6 IMPLANT
ELECT REM PT RETURN 9FT ADLT (ELECTROSURGICAL) ×3
ELECTRODE REM PT RTRN 9FT ADLT (ELECTROSURGICAL) ×2 IMPLANT
GAUZE SPONGE 4X4 12PLY STRL (GAUZE/BANDAGES/DRESSINGS) IMPLANT
GLOVE BIOGEL M STRL SZ7.5 (GLOVE) ×3 IMPLANT
GOWN STRL REUS W/TWL XL LVL3 (GOWN DISPOSABLE) ×9 IMPLANT
HOVERMATT SINGLE USE (MISCELLANEOUS) ×3 IMPLANT
KIT BASIN OR (CUSTOM PROCEDURE TRAY) ×3 IMPLANT
NEEDLE SPNL 22GX3.5 QUINCKE BK (NEEDLE) ×3 IMPLANT
PACK UNIVERSAL I (CUSTOM PROCEDURE TRAY) ×3 IMPLANT
PEN SKIN MARKING BROAD (MISCELLANEOUS) ×3 IMPLANT
POUCH ENDO CATCH II 15MM (MISCELLANEOUS) ×3 IMPLANT
RELOAD STAPLER 60MM BLK (STAPLE) ×2 IMPLANT
RELOAD STAPLER BLUE 60MM (STAPLE) ×6 IMPLANT
RELOAD STAPLER GOLD 60MM (STAPLE) ×2 IMPLANT
RELOAD STAPLER GREEN 60MM (STAPLE) ×2 IMPLANT
SCISSORS LAP 5X45 EPIX DISP (ENDOMECHANICALS) ×3 IMPLANT
SEALANT SURGICAL APPL DUAL CAN (MISCELLANEOUS) IMPLANT
SET IRRIG TUBING LAPAROSCOPIC (IRRIGATION / IRRIGATOR) ×3 IMPLANT
SHEARS HARMONIC ACE PLUS 45CM (MISCELLANEOUS) ×3 IMPLANT
SLEEVE ADV FIXATION 5X100MM (TROCAR) ×6 IMPLANT
SLEEVE GASTRECTOMY 36FR VISIGI (MISCELLANEOUS) ×3 IMPLANT
SLEEVE XCEL OPT CAN 5 100 (ENDOMECHANICALS) IMPLANT
SOLUTION ANTI FOG 6CC (MISCELLANEOUS) ×3 IMPLANT
SPONGE LAP 18X18 X RAY DECT (DISPOSABLE) ×3 IMPLANT
STAPLER ECHELON BIOABSB 60 FLE (MISCELLANEOUS) ×18 IMPLANT
STAPLER ECHELON LONG 60 440 (INSTRUMENTS) IMPLANT
STAPLER RELOAD 60MM BLK (STAPLE) ×3
STAPLER RELOAD BLUE 60MM (STAPLE) ×9
STAPLER RELOAD GOLD 60MM (STAPLE) ×3
STAPLER RELOAD GREEN 60MM (STAPLE) ×3
SUT MNCRL AB 4-0 PS2 18 (SUTURE) ×3 IMPLANT
SUT SURGIDAC NAB ES-9 0 48 120 (SUTURE) IMPLANT
SUT VICRYL 0 TIES 12 18 (SUTURE) ×3 IMPLANT
SYR 10ML ECCENTRIC (SYRINGE) ×3 IMPLANT
SYR 20CC LL (SYRINGE) ×3 IMPLANT
SYR 50ML LL SCALE MARK (SYRINGE) ×3 IMPLANT
TOWEL OR 17X26 10 PK STRL BLUE (TOWEL DISPOSABLE) ×3 IMPLANT
TOWEL OR NON WOVEN STRL DISP B (DISPOSABLE) ×3 IMPLANT
TRAY FOLEY W/METER SILVER 14FR (SET/KITS/TRAYS/PACK) IMPLANT
TRAY FOLEY W/METER SILVER 16FR (SET/KITS/TRAYS/PACK) IMPLANT
TROCAR ADV FIXATION 5X100MM (TROCAR) ×3 IMPLANT
TROCAR BLADELESS 15MM (ENDOMECHANICALS) ×3 IMPLANT
TROCAR BLADELESS OPT 5 100 (ENDOMECHANICALS) ×3 IMPLANT
TROCAR XCEL 12X100 BLDLESS (ENDOMECHANICALS) ×3 IMPLANT
TUBING CONNECTING 10 (TUBING) ×3 IMPLANT
TUBING ENDO SMARTCAP (MISCELLANEOUS) ×3 IMPLANT
TUBING FILTER THERMOFLATOR (ELECTROSURGICAL) ×3 IMPLANT

## 2015-01-24 NOTE — Anesthesia Preprocedure Evaluation (Signed)
Anesthesia Evaluation  Patient identified by MRN, date of birth, ID band Patient awake    Reviewed: Allergy & Precautions, NPO status , Patient's Chart, lab work & pertinent test results  Airway Mallampati: I  TM Distance: >3 FB Neck ROM: Full    Dental   Pulmonary    Pulmonary exam normal        Cardiovascular hypertension, Pt. on medications Normal cardiovascular exam     Neuro/Psych Anxiety    GI/Hepatic   Endo/Other  diabetes, Type 2  Renal/GU      Musculoskeletal   Abdominal   Peds  Hematology   Anesthesia Other Findings   Reproductive/Obstetrics                             Anesthesia Physical Anesthesia Plan  ASA: III  Anesthesia Plan: General   Post-op Pain Management:    Induction: Intravenous  Airway Management Planned: Oral ETT  Additional Equipment:   Intra-op Plan:   Post-operative Plan: Extubation in OR  Informed Consent: I have reviewed the patients History and Physical, chart, labs and discussed the procedure including the risks, benefits and alternatives for the proposed anesthesia with the patient or authorized representative who has indicated his/her understanding and acceptance.     Plan Discussed with: CRNA and Surgeon  Anesthesia Plan Comments:         Anesthesia Quick Evaluation

## 2015-01-24 NOTE — Transfer of Care (Signed)
Immediate Anesthesia Transfer of Care Note  Patient: Charles Morton  Procedure(s) Performed: Procedure(s): LAPAROSCOPIC GASTRIC SLEEVE RESECTION (N/A) UPPER GI ENDOSCOPY  Patient Location: PACU  Anesthesia Type:General  Level of Consciousness: awake, alert  and oriented  Airway & Oxygen Therapy: Patient Spontanous Breathing and Patient connected to face mask oxygen  Post-op Assessment: Report given to RN and Post -op Vital signs reviewed and stable  Post vital signs: Reviewed and stable  Last Vitals:  Filed Vitals:   01/24/15 1152  BP: 140/93  Pulse: 88  Temp: 37.1 C  Resp: 18    Complications: No apparent anesthesia complications

## 2015-01-24 NOTE — H&P (View-Only) (Signed)
Charles Morton 1995/12/29  03/13/2014 4:07 PM Location: [facility] Patient #: [phone] DOB: [birth year] Single / Language: Charles Morton / Race: White Male  History of Present Illness Charles Morton M. Ruxin [name]; 01/12/2015 5:15 PM) Patient words: bariatric.  The patient is a 19 year old male who presents for a pre-op visit. He comes in today for his preoperative visit. He has been approved for laparoscopic sleeve gastrectomy with upper endoscopy. I initially met him on June 16. His weight at that time was 302 pounds. His weight today is 327 pounds. He started his preoperative diet last week. He states that he weighed 335 pounds when he started his preoperative diet. He is accompanied by his mother. He denies any changes to his medical history since we initially met. He denies any fever, chills, chest pain, shortness of breath, dyspnea on exertion, orthopnea, paroxysmal nocturnal dyspnea, abdominal pain, diarrhea or constipation. He states that his reflux is actually much improved.  His preoperative workup showed that he had a diffuse fatty liver on abdominal ultrasound without gallstones. His upper GI was unremarkable. His evaluation labs were unremarkable except for hemoglobin A1c level of 5.8. His LFTs were normal when we checked him. He has been seen by nutrition and psychology and cleared for surgery.   Problem List/Past Medical Charles Morton Charles Derby, MD; 01/12/2015 5:15 PM) MILD OBSTRUCTIVE SLEEP APNEA (G47.33) PREDIABETES (R73.03) ELEVATED BLOOD PRESSURE (I10) FATTY LIVER DISEASE, NONALCOHOLIC (J62.8) SNORING (R06.83) PILONIDAL ABSCESS (L05.01) OBESITY, MORBID, BMI 40.0-49.9 (E66.01) GASTROESOPHAGEAL REFLUX DISEASE, ESOPHAGITIS PRESENCE NOT SPECIFIED (K21.9)  Other Problems Charles Curry, MD; 01/12/2015 5:15 PM) Other disease, cancer, significant illness  Diagnostic Studies History Charles Curry, MD; 01/12/2015 5:15 PM) Colonoscopy never  Allergies ([place]; 01/12/2015 4:08 PM) BuSpar *ANTIANXIETY AGENTS* Depakote *ANTICONVULSANTS* Intuniv *ADHD/ANTI-NARCOLEPSY/ANTI-OBESITY/ANOREXIANTS*  Medication History Charles Curry, MD; 01/12/2015 5:15 PM) PROzac (20MG  Capsule, Oral) Active. CloNIDine HCl (0.2MG  Tablet, Oral) Active. Medications Reconciled OxyCODONE HCl (5MG /5ML Solution, 5-10 Milliliter Oral every four hours, as needed, Taken starting 01/12/2015) Active. Protonix (20MG  Tablet DR, 1 (one) Tablet DR Oral daily, Taken starting 01/12/2015) Active. Zofran ODT (4MG  Tablet Disperse, 1 (one) Tablet Disperse Oral every four hours, as needed, Taken starting 01/12/2015) Active. ZyrTEC Childrens Allergy (10MG  Tablet Chewable, Oral) Active. Adderall XR (30MG  Capsule ER 24HR, Oral) Active.  Social History Charles Curry, MD; 01/12/2015 5:15 PM) No drug use No alcohol use Tobacco use Never smoker. Caffeine use Tea.  Family History Charles Curry, MD; 01/12/2015 5:15 PM) Arthritis Mother. Migraine Headache Mother.     Review of Systems Charles Morton M. Charles Valent MD; 01/12/2015 5:13 PM) General Present- Weight Gain. Not Present- Appetite Loss, Chills, Fatigue, Fever, Night Sweats and Weight Loss. Skin Not Present- Change in Wart/Mole, Dryness, Hives, Jaundice, New Lesions, Non-Healing Wounds, Rash and Ulcer. HEENT Not Present- Earache, Hearing Loss, Hoarseness, Nose Bleed, Oral Ulcers, Ringing in the Ears, Seasonal Allergies, Sinus Pain, Sore Throat, Visual Disturbances, Wears glasses/contact lenses and Yellow Eyes. Respiratory Not Present- Bloody sputum, Chronic Cough, Difficulty Breathing, Snoring and Wheezing. Breast Not Present- Breast Mass, Breast Pain, Nipple Discharge and Skin Changes. Cardiovascular Not Present- Chest Pain, Difficulty Breathing Lying Down, Leg Cramps, Palpitations, Rapid Heart Rate, Shortness of Breath and Swelling of Extremities. Gastrointestinal Not Present- Abdominal Pain, Bloating, Bloody Stool, Change in  Bowel Habits, Chronic diarrhea, Constipation, Difficulty Swallowing, Excessive gas, Gets full quickly at meals, Hemorrhoids, Indigestion, Nausea, Rectal Pain and Vomiting. Male Genitourinary Not Present- Blood in Urine, Change in Urinary Stream, Frequency, Impotence,  Nocturia, Painful Urination, Urgency and Urine Leakage. Neurological Not Present- Decreased Memory, Fainting, Headaches, Numbness, Seizures, Tingling, Tremor, Trouble walking and Weakness. Psychiatric Present- Anxiety. Not Present- Bipolar, Change in Sleep Pattern, Depression, Fearful and Frequent crying. Endocrine Not Present- Cold Intolerance, Excessive Hunger, Hair Changes, Heat Intolerance and New Diabetes. Hematology Not Present- Easy Bruising, Excessive bleeding, Gland problems, HIV and Persistent Infections.  Vitals (Charles Morton CMA; 01/12/2015 4:08 PM) 01/12/2015 4:08 PM Weight: 327 lb (Above 99th percentile) Height: 69in (42nd percentile) Body Surface Area: 2.55 m Body Mass Index: 48.29 kg/m  (Above 99th percentile)  Temp.: 35F(Temporal)  Pulse: 106 (Regular)  BP: 122/78 (Sitting, Left Arm, Standard)  Percentiles calculated using CDC data for children 2-20 years.    Physical Exam Charles Morton M. Devi Hopman MD; 01/12/2015 5:13 PM)  General Mental Status-Alert. General Appearance-Consistent with stated age. Hydration-Well hydrated. Voice-Normal. Note: morbidly obese  Head and Neck Head-normocephalic, atraumatic with no lesions or palpable masses. Trachea-midline. Thyroid Gland Characteristics - normal size and consistency.  Eye Eyeball - Bilateral-Extraocular movements intact. Sclera/Conjunctiva - Bilateral-No scleral icterus.  Chest and Lung Exam Chest and lung exam reveals -quiet, even and easy respiratory effort with no use of accessory muscles and on auscultation, normal breath sounds, no adventitious sounds and normal vocal resonance. Inspection Chest Wall - Normal. Back -  normal.  Breast - Did not examine.  Cardiovascular Cardiovascular examination reveals -normal heart sounds, regular rate and rhythm with no murmurs and normal pedal pulses bilaterally.  Abdomen Inspection Inspection of the abdomen reveals - No Hernias. Skin - Scar - no surgical scars. Palpation/Percussion Palpation and Percussion of the abdomen reveal - Soft, Non Tender, No Rebound tenderness, No Rigidity (guarding) and No hepatosplenomegaly. Auscultation Auscultation of the abdomen reveals - Bowel sounds normal.  Peripheral Vascular Upper Extremity Palpation - Pulses bilaterally normal.  Neurologic Neurologic evaluation reveals -alert and oriented x 3 with no impairment of recent or remote memory. Mental Status-Normal.  Neuropsychiatric The patient's mood and affect are described as -normal. Judgment and Insight-insight is appropriate concerning matters relevant to self.  Musculoskeletal Normal Exam - Left-Upper Extremity Strength Normal and Lower Extremity Strength Normal. Normal Exam - Right-Upper Extremity Strength Normal and Lower Extremity Strength Normal.  Lymphatic Head & Neck  General Head & Neck Lymphatics: Bilateral - Description - Normal. Axillary - Did not examine. Femoral & Inguinal - Did not examine.    Assessment & Plan Charles Morton M. Ajooni Karam MD; 01/12/2015 5:13 PM)  OBESITY, MORBID, BMI 40.0-49.9 (E66.01) Impression: We reviewed his preoperative workup. He is accompanied by his mother. We discussed the results of all his test and imaging and labs. We discussed the importance of the preoperative diet. We rediscussed the typical postoperative path. We also discussed some of the important postoperative complications. All of his questions were asked and answered. He was given prescriptions today for postoperative pain, nausea and heartburn.  Current Plans Pt Education - EMW_preopbariatric ELEVATED BLOOD PRESSURE (I10) Impression: His blood pressure  is much improved today.  GASTROESOPHAGEAL REFLUX DISEASE, ESOPHAGITIS PRESENCE NOT SPECIFIED (K21.9)  FATTY LIVER DISEASE, NONALCOHOLIC (H88.5) Impression: We discussed the finding of fatty liver disease on his abdominal ultrasound. We discussed the most important thing for this would be weight loss. I did explain that we will need to keep an eye on this long-term.  PREDIABETES (R73.03) Impression: We discussed the finding of him having a hemoglobin A1c of 5.8. The specimen the prediabetic range. We discussed the importance of good food choices as well as weight loss.  MILD OBSTRUCTIVE SLEEP APNEA (G47.33) Impression: His sleep study showed mild obstructive sleep apnea. The pulmonologist recommended either weight loss or consideration of CPAP. Since his sleep apnea was mild we will hold off on CPAP for now  Leighton Ruff. Redmond Pulling, MD, FACS General, Bariatric, & Minimally Invasive Surgery [facility], [place]

## 2015-01-24 NOTE — Anesthesia Postprocedure Evaluation (Signed)
Anesthesia Post Note  Patient: Charles Morton  Procedure(s) Performed: Procedure(s) (LRB): LAPAROSCOPIC GASTRIC SLEEVE RESECTION (N/A) UPPER GI ENDOSCOPY  Patient location during evaluation: PACU Anesthesia Type: General Level of consciousness: awake and alert Pain management: pain level controlled Vital Signs Assessment: post-procedure vital signs reviewed and stable Respiratory status: spontaneous breathing, nonlabored ventilation, respiratory function stable and patient connected to nasal cannula oxygen Cardiovascular status: blood pressure returned to baseline and stable Postop Assessment: No signs of nausea or vomiting Anesthetic complications: no    Last Vitals:  Filed Vitals:   01/24/15 1730 01/24/15 1745  BP: 133/83 136/81  Pulse: 90 87  Temp:    Resp: 17 13    Last Pain: There were no vitals filed for this visit.               Charles Morton DAVID

## 2015-01-24 NOTE — Op Note (Signed)
01/24/2015 Charles Morton Jan 27, 1996 [id number]   PRE-OPERATIVE DIAGNOSIS:     Obesity, Class III, BMI 40-49.9 (morbid obesity) (HCC)   Mild obstructive sleep apnea   Prediabetes   Fatty liver disease, nonalcoholic   Elevated blood pressure  POST-OPERATIVE DIAGNOSIS:  same  PROCEDURE:  Procedure(s): LAPAROSCOPIC SLEEVE GASTRECTOMY UPPER GI ENDOSCOPY  SURGEON:  Surgeon(s): Atilano InaEric M Kazuko Clemence, MD FACS FASMBS  ASSISTANTS: Feliciana RossettiLuke Kinsinger, MD  ANESTHESIA:   general  DRAINS: none   BOUGIE: 36 fr ViSiGi  LOCAL MEDICATIONS USED:  MARCAINE + Exparel  SPECIMEN:  Source of Specimen:  Greater curvature of stomach  DISPOSITION OF SPECIMEN:  PATHOLOGY  COUNTS:  YES  INDICATION FOR PROCEDURE: This is a very pleasant 19 year old morbidly obese  who has had unsuccessful attempts for sustained weight loss. He presents today for a planned laparoscopic sleeve gastrectomy with upper endoscopy. We have discussed the risk and benefits of the procedure extensively preoperatively. Please see my separate notes.  PROCEDURE: After obtaining informed consent and receiving 5000 units of subcutaneous heparin, the patient [facility] and placed supine on the operating room table. General endotracheal anesthesia was established. Sequential compression devices were placed. A orogastric tube was placed. The patient's abdomen was prepped and draped in the usual standard surgical fashion. He received preoperative IV antibiotics. A surgical timeout was performed.  Access to the abdomen was achieved using a 5 mm 0 laparoscope thru a 5 mm trocar In the left upper Quadrant 2 fingerbreadths below the left subcostal margin using the Optiview technique. Pneumoperitoneum was smoothly established up to 15 mm of mercury. The laparoscope was advanced and the abdominal cavity was surveilled. The patient was then placed in reverse Trendelenburg. There was no evidence of a hiatal hernia  on laparoscopy - gap in the left and right crus anteriorly.  A 5 mm trocar was placed slightly above and to the left of the umbilicus under direct visualization.  The [facility] liver retractor was placed under the left lobe of the liver through a 5 mm trocar incision site in the subxiphoid position. A 5 mm trocar was placed in the lateral right upper quadrant along with a 15 mm trocar in the mid right abdomen. A final 5 mm trocar was placed in the lateral LUQ.  All under direct visualization after local had been infiltrated.  The stomach was inspected. It was completely decompressed and the orogastric tube was removed.  There was no small anterior dimple that was obviously visible. The calibration tube was placed in the oropharynx and guided down into the stomach by the CRNA. 10 mL of air was insufflated into the calibration balloon. The calibration tubing was then gently pulled back by the CRNA and it did not slide past the GE junction. At this point the calibration tubing was desufflated and removed. His preop UGI did not a show hiatal hernia.   We identified the pylorus and measured 6 cm proximal to the pylorus and identified an area of where we would start taking down the short gastric vessels. Harmonic scalpel was used to take down the short gastric vessels along the greater curvature of the stomach. We were able to enter the lesser sac. We continued to march along the greater curvature of the stomach taking down the short gastrics. As we approached the gastrosplenic ligament we took care in this area not to injure the spleen. We were able to take down the entire gastrosplenic ligament. We then mobilized  the fundus away from the left crus of diaphragm. There were a few significant posterior gastric avascular attachments which were taken down. This left the stomach completely mobilized. No vessels had been taken down along the lesser curvature of the stomach.  We then reidentified the pylorus. A 36Fr  ViSiGi was then placed in the oropharynx and advanced down into the stomach and placed in the distal antrum and positioned along the lesser curvature. It was placed under suction which secured the 36Fr ViSiGi in place along the lesser curve. Then using the Ethicon echelon 60 mm stapler with a black load with Seamguard, I placed a stapler along the antrum approximately 5 cm from the pylorus. The stapler was angled so that there is ample room at the angularis incisura. I then fired the first staple load after inspecting it posteriorly to ensure adequate space both anteriorly and posteriorly. At this point I used a green load with Seamguard, I placed the stapler in position just inside the prior stapleline. We then rotated the stomach to insure that there was adequate anteriorly as well as posteriorly. The stapler was then fired. I upsized the mid left abdomen 5 mm trocar up to a 12 mm trocar to staple thru. I used a 60mm gold cartridge with seamguard. At this point I started using 60 mm blue load staple cartridges with Seamguard. The echelon stapler was then repositioned with a 60 mm gold load with Seamguard and we continued to march up along the ViSiGi. My assistant was holding traction along the greater curvature stomach along the cauterized short gastric vessels ensuring that the stomach was symmetrically retracted. Prior to each firing of the staple, we rotated the stomach to ensure that there is adequate stomach left.  As we approached the fundus, I used 60 mm blue cartridge with Seamguard aiming slightly lateral to the esophageal fat pad. Although the staples on this fire had completely gone thru the last part of the stomach it had not completely cut it. Therefore 1 additional 60 blue load was used to free the remaining stomach. The sleeve was inspected. There is no evidence of cork screw. The staple line appeared hemostatic. The CRNA inflated the ViSiGi to the green zone and the upper abdomen was flooded with  saline. There were no bubbles. The sleeve was decompressed and the ViSiGi removed. My assistant scrubbed out and performed an upper endoscopy. The sleeve easily distended with air and the scope was easily advanced to the pylorus. There is no evidence of internal bleeding or cork screwing. There was no narrowing at the angularis. There is no evidence of bubbles. Please see his operative note for further details. The gastric sleeve was decompressed and the endoscope was removed.  The greater curvature the stomach was grasped with a laparoscopic grasper and removed from the 15 mm trocar site.  The liver retractor was removed. I then closed the 15 mm trocar site with 2 interrupted 0 Vicryl sutures through the fascia using the endoclose. The closure was viewed laparoscopically and it was airtight. 60 cc of Exparel was then infiltrated in the preperitoneal spaces around the trocar sites. Pneumoperitoneum was released. All trocar sites were closed with a 4-0 Monocryl in a subcuticular fashion followed by the application of Dermabond. The patient was extubated and taken to the recovery room in stable condition. All needle, instrument, and sponge counts were correct x2. There are no immediate complications  (1) 60 mm black with Seamguard (1) 60 mm green with Seamguard (1) 60  mm gold with seamguard (3) 60 mm blue with 2 seamguard  PLAN OF CARE: Admit to inpatient   PATIENT DISPOSITION:  PACU - hemodynamically stable.   Delay start of Pharmacological VTE agent (>24hrs) due to surgical blood loss or risk of bleeding:  no  Mary Sella. Andrey Campanile, MD, FACS FASMBS General, Bariatric, & Minimally Invasive Surgery [facility], [place]

## 2015-01-24 NOTE — Op Note (Signed)
Preoperative diagnosis: laparoscopic sleeve gastrectomy  Postoperative diagnosis: Same   Procedure: Upper endoscopy   Surgeon: Luke Kinsinger, M.D.  Anesthesia: Gen.   Indications for procedure: This patient was undergoing a laparoscopic sleeve gastrectomy.   Description of procedure: The endoscopy was placed in the mouth and into the oropharynx and under endoscopic vision it was advanced to the esophagogastric junction. The pouch was insufflated and no bleeding or bubbles were seen. The GEJ was identified at 44cm from the teeth. No bleeding or leaks were detected. The scope was withdrawn without difficulty.   Luke Kinsinger, M.D. General, Bariatric, & Minimally Invasive Surgery [facility], [place]     

## 2015-01-24 NOTE — Interval H&P Note (Signed)
History and Physical Interval Note:  01/24/2015 2:08 PM  Charles Morton  has presented today for surgery, with the diagnosis of morbid obesity  The various methods of treatment have been discussed with the patient and family. After consideration of risks, benefits and other options for treatment, the patient has consented to  Procedure(s): LAPAROSCOPIC GASTRIC SLEEVE RESECTION (N/A) as a surgical intervention .  The patient's history has been reviewed, patient examined, no change in status, stable for surgery.  I have reviewed the patient's chart and labs.  Questions were answered to the patient's satisfaction.   Mary SellaEric M. Andrey CampanileWilson, MD, FACS General, Bariatric, & Minimally Invasive Surgery [facility], [place]   [facility]ILSON,[name]

## 2015-01-24 NOTE — Anesthesia Procedure Notes (Signed)
Procedure Name: Intubation Date/Time: 01/24/2015 2:21 PM Performed by: Illene SilverEVANS, Emarion Toral E Pre-anesthesia Checklist: Patient identified, Emergency Drugs available, Suction available and Patient being monitored Patient Re-evaluated:Patient Re-evaluated prior to inductionOxygen Delivery Method: Circle System Utilized Preoxygenation: Pre-oxygenation with 100% oxygen Intubation Type: IV induction Ventilation: Mask ventilation without difficulty Laryngoscope Size: Mac and 4 Grade View: Grade II Tube type: Oral Tube size: 8.0 mm Number of attempts: 1 Airway Equipment and Method: Stylet and Oral airway Placement Confirmation: ETT inserted through vocal cords under direct vision,  positive ETCO2 and breath sounds checked- equal and bilateral Secured at: 21 cm Tube secured with: Tape Dental Injury: Teeth and Oropharynx as per pre-operative assessment

## 2015-01-25 ENCOUNTER — Encounter (HOSPITAL_COMMUNITY): Payer: Self-pay | Admitting: General Surgery

## 2015-01-25 ENCOUNTER — Inpatient Hospital Stay (HOSPITAL_COMMUNITY): Payer: 59

## 2015-01-25 LAB — COMPREHENSIVE METABOLIC PANEL
ALK PHOS: 54 U/L (ref 38–126)
ALT: 156 U/L — ABNORMAL HIGH (ref 17–63)
ANION GAP: 9 (ref 5–15)
AST: 90 U/L — ABNORMAL HIGH (ref 15–41)
Albumin: 4.5 g/dL (ref 3.5–5.0)
BILIRUBIN TOTAL: 0.8 mg/dL (ref 0.3–1.2)
BUN: 15 mg/dL (ref 6–20)
CALCIUM: 9.5 mg/dL (ref 8.9–10.3)
CO2: 27 mmol/L (ref 22–32)
Chloride: 101 mmol/L (ref 101–111)
Creatinine, Ser: 1.01 mg/dL (ref 0.61–1.24)
GFR calc non Af Amer: >60 mL/min (ref >60)
Glucose, Bld: 129 mg/dL — ABNORMAL HIGH (ref 65–99)
Potassium: 4.5 mmol/L (ref 3.5–5.1)
Sodium: 137 mmol/L (ref 135–145)
TOTAL PROTEIN: 7.5 g/dL (ref 6.5–8.1)

## 2015-01-25 LAB — CBC WITH DIFFERENTIAL/PLATELET
Basophils Absolute: 0 10*3/uL (ref 0.0–0.1)
Basophils Relative: 0 %
Eosinophils Absolute: 0 10*3/uL (ref 0.0–0.7)
Eosinophils Relative: 0 %
HEMATOCRIT: 40.7 % (ref 39.0–52.0)
HEMOGLOBIN: 13.4 g/dL (ref 13.0–17.0)
LYMPHS ABS: 1.4 10*3/uL (ref 0.7–4.0)
Lymphocytes Relative: 14 %
MCH: 30 pg (ref 26.0–34.0)
MCHC: 32.9 g/dL (ref 30.0–36.0)
MCV: 91.1 fL (ref 78.0–100.0)
MONOS PCT: 5 %
Monocytes Absolute: 0.5 10*3/uL (ref 0.1–1.0)
NEUTROS ABS: 8.3 10*3/uL — AB (ref 1.7–7.7)
NEUTROS PCT: 81 %
Platelets: 287 10*3/uL (ref 150–400)
RBC: 4.47 MIL/uL (ref 4.22–5.81)
RDW: 12.7 % (ref 11.5–15.5)
WBC: 10.1 10*3/uL (ref 4.0–10.5)

## 2015-01-25 LAB — GLUCOSE, CAPILLARY
GLUCOSE-CAPILLARY: 128 mg/dL — AB (ref 65–99)
Glucose-Capillary: 130 mg/dL — ABNORMAL HIGH (ref 65–99)
Glucose-Capillary: 98 mg/dL (ref 65–99)

## 2015-01-25 LAB — HEMOGLOBIN AND HEMATOCRIT, BLOOD
HEMATOCRIT: 39.5 % (ref 39.0–52.0)
Hemoglobin: 13.2 g/dL (ref 13.0–17.0)

## 2015-01-25 MED ORDER — IOHEXOL 300 MG/ML  SOLN
50.0000 mL | Freq: Once | INTRAMUSCULAR | Status: DC | PRN
  Administered 2015-01-25: 50 mL via ORAL
  Filled 2015-01-25: qty 50

## 2015-01-25 MED ORDER — PROMETHAZINE HCL 25 MG/ML IJ SOLN
25.0000 mg | Freq: Four times a day (QID) | INTRAMUSCULAR | Status: DC | PRN
  Administered 2015-01-25 (×2): 25 mg via INTRAVENOUS
  Filled 2015-01-25 (×2): qty 1

## 2015-01-25 MED ORDER — METOCLOPRAMIDE HCL 5 MG/ML IJ SOLN
10.0000 mg | Freq: Four times a day (QID) | INTRAMUSCULAR | Status: DC | PRN
  Filled 2015-01-25: qty 2

## 2015-01-25 MED ORDER — SODIUM CHLORIDE 0.9 % IV SOLN
8.0000 mg | INTRAVENOUS | Status: DC | PRN
  Administered 2015-01-25: 8 mg via INTRAVENOUS
  Filled 2015-01-25 (×3): qty 4

## 2015-01-25 NOTE — Progress Notes (Signed)
Called by nursing staff secondary to lots of N/V and frequent use of antiemetics.  Will make NPO (no water/ice).  UGI with no obstruction.  Increase Zofran to 8mg  and phenergan to 25mg .    Jorje GuildMegan Amirah Goerke, PA-C [facility] Surgery [facility] [phone]

## 2015-01-25 NOTE — Plan of Care (Signed)
Problem: Food- and Nutrition-Related Knowledge Deficit (NB-1.1) Goal: Nutrition education Formal process to instruct or train a patient/client in a skill or to impart knowledge to help patients/clients voluntarily manage or modify food choices and eating behavior to maintain or improve health. Outcome: Completed/Met Date Met:  01/25/15 Nutrition Education Note  Received consult for diet education per DROP protocol.   Discussed 2 week post op diet with pt. Emphasized that liquids must be non carbonated, non caffeinated, and sugar free. Fluid goals discussed. Pt to follow up with outpatient bariatric RD for further diet progression after 2 weeks. Multivitamins and minerals also reviewed. Teach back method used, pt expressed understanding, expect good compliance.   Diet: First 2 Weeks  You will see the nutritionist about two (2) weeks after your surgery. The nutritionist will increase the types of foods you can eat if you are handling liquids well:  If you have severe vomiting or nausea and cannot handle clear liquids lasting longer than 1 day, call your surgeon  Protein Shake  Drink at least 2 ounces of shake 5-6 times per day  Each serving of protein shakes (usually 8 - 12 ounces) should have a minimum of:  15 grams of protein  And no more than 5 grams of carbohydrate  Goal for protein each day:  Men = 80 grams per day  Women = 60 grams per day  Protein powder may be added to fluids such as non-fat milk or Lactaid milk or Soy milk (limit to 35 grams added protein powder per serving)   Hydration  Slowly increase the amount of water and other clear liquids as tolerated (See Acceptable Fluids)  Slowly increase the amount of protein shake as tolerated  Sip fluids slowly and throughout the day  May use sugar substitutes in small amounts (no more than 6 - 8 packets per day; i.e. Splenda)   Fluid Goal  The first goal is to drink at least 8 ounces of protein shake/drink per day (or as directed  by the nutritionist); some examples of protein shakes are Syntrax Nectar, Adkins Advantage, EAS Edge HP, and Unjury. See handout from pre-op Bariatric Education Class:  Slowly increase the amount of protein shake you drink as tolerated  You may find it easier to slowly sip shakes throughout the day  It is important to get your proteins in first  Your fluid goal is to drink 64 - 100 ounces of fluid daily  It may take a few weeks to build up to this  32 oz (or more) should be clear liquids  And  32 oz (or more) should be full liquids (see below for examples)  Liquids should not contain sugar, caffeine, or carbonation   Clear Liquids:  Water or Sugar-free flavored water (i.e. Fruit H2O, Propel)  Decaffeinated coffee or tea (sugar-free)  Crystal Lite, Wyler's Lite, Minute Maid Lite  Sugar-free Jell-O  Bouillon or broth  Sugar-free Popsicle: *Less than 20 calories each; Limit 1 per day   Full Liquids:  Protein Shakes/Drinks + 2 choices per day of other full liquids  Full liquids must be:  No More Than 12 grams of Carbs per serving  No More Than 3 grams of Fat per serving  Strained low-fat cream soup  Non-Fat milk  Fat-free Lactaid Milk  Sugar-free yogurt (Dannon Lite & Fit, Greek yogurt)     Charles Duvall, MS, RD, LDN Pager: [phone] After Hours Pager: [phone]        

## 2015-01-25 NOTE — Progress Notes (Signed)
Patient alert and oriented, pain is controlled. Patient is tolerating fluids, plan to advance to protein shake tomorrow.  Reviewed Gastric sleeve discharge instructions with patient and patient is able to articulate understanding.  Provided information on BELT program, Support Group and [facility] outpatient pharmacy. All questions answered, will continue to monitor.  

## 2015-01-25 NOTE — Care Management Note (Signed)
Case Management Note  Patient Details  Name: Denman Georgeyler W Paredez MRN: [id number] Date of Birth: 1995-08-11  Subjective/Objective:    S/p laparoscopic sleeve gastrectomy with upper endoscopy                Action/Plan: Discharge planning, no new needs identified  Expected Discharge Date:                  Expected Discharge Plan:  Home/Self Care  In-House Referral:  NA  Discharge planning Services  CM Consult  Post Acute Care Choice:  NA Choice offered to:  NA  DME Arranged:  N/A DME Agency:  NA  HH Arranged:  NA HH Agency:  NA  Status of Service:  Completed, signed off  Medicare Important Message Given:    Date Medicare IM Given:    Medicare IM give by:    Date Additional Medicare IM Given:    Additional Medicare Important Message give by:     If discussed at Long Length of Stay Meetings, dates discussed:    Additional Comments:  Alexis Goodelleele, Audris Speaker K, RN 01/25/2015, 10:51 AM

## 2015-01-25 NOTE — Progress Notes (Signed)
1 Day Post-Op  Subjective: Had nausea overnight. Some pain. No emesis. Walked. Did IS  Objective: Vital signs in last 24 hours: Temp:  [98.1 F (36.7 C)-99.1 F (37.3 C)] 98.5 F (36.9 C) (11/23 0548) Pulse Rate:  [81-106] 89 (11/23 0548) Resp:  [13-29] 16 (11/23 0548) BP: (103-145)/(51-93) 105/54 mmHg (11/23 0548) SpO2:  [96 %-100 %] 98 % (11/23 0548) Weight:  [144.153 kg (317 lb 12.8 oz)] 144.153 kg (317 lb 12.8 oz) (11/22 1152) Last BM Date: 01/23/15  Intake/Output from previous day: 11/22 0701 - 11/23 0700 In: 3977.1 [I.V.:3977.1] Out: 1450 [Urine:1450] Intake/Output this shift:    Looks good[facility] Nontoxic cta b/l Reg Soft, nt, nd, incisions c/d/i No edema, +SCDs  Lab Results:   Recent Labs  01/24/15 2015 01/25/15 0510  WBC  --  10.1  HGB 14.1 13.4  HCT 42.0 40.7  PLT  --  287   BMET  Recent Labs  01/24/15 2130 01/25/15 0510  NA  --  137  K  --  4.5  CL  --  101  CO2  --  27  GLUCOSE  --  129*  BUN  --  15  CREATININE 1.21 1.01  CALCIUM  --  9.5   PT/INR No results for input(s): LABPROT, INR in the last 72 hours. ABG No results for input(s): PHART, HCO3 in the last 72 hours.  Invalid input(s): PCO2, PO2  Studies/Results: No results found.  Anti-infectives: Anti-infectives    Start     Dose/Rate Route Frequency Ordered Stop   01/25/15 0600  cefoTEtan in Dextrose 5% (CEFOTAN) IVPB 2 g     2 g Intravenous On call to O.R. 01/24/15 1202 01/24/15 1415      Assessment/Plan: s/p Procedure(s): LAPAROSCOPIC GASTRIC SLEEVE RESECTION (N/A) UPPER GI ENDOSCOPY  Looks good. No fever, no tachycardia For UGI this am, if ok will start POD 1 diet Cont vte prophylaxis pulm toilet, IS Stop finger sticks - glucose ok Dr Ezzard StandingNewman will be rounding for me rest of week  Mary SellaEric M. Andrey CampanileWilson, MD, FACS General, Bariatric, & Minimally Invasive Surgery [facility] [place]   LOS: 1 day    Atilano InaWILSON,Barney Russomanno M 01/25/2015

## 2015-01-25 NOTE — Discharge Instructions (Signed)

## 2015-01-26 LAB — CBC WITH DIFFERENTIAL/PLATELET
BASOS ABS: 0 10*3/uL (ref 0.0–0.1)
Basophils Relative: 0 %
Eosinophils Absolute: 0 10*3/uL (ref 0.0–0.7)
Eosinophils Relative: 0 %
HEMATOCRIT: 39.3 % (ref 39.0–52.0)
Hemoglobin: 13.3 g/dL (ref 13.0–17.0)
LYMPHS ABS: 2.4 10*3/uL (ref 0.7–4.0)
LYMPHS PCT: 26 %
MCH: 30.6 pg (ref 26.0–34.0)
MCHC: 33.8 g/dL (ref 30.0–36.0)
MCV: 90.6 fL (ref 78.0–100.0)
MONO ABS: 0.9 10*3/uL (ref 0.1–1.0)
MONOS PCT: 9 %
NEUTROS ABS: 5.8 10*3/uL (ref 1.7–7.7)
Neutrophils Relative %: 65 %
Platelets: 231 10*3/uL (ref 150–400)
RBC: 4.34 MIL/uL (ref 4.22–5.81)
RDW: 12.6 % (ref 11.5–15.5)
WBC: 9.1 10*3/uL (ref 4.0–10.5)

## 2015-01-26 NOTE — Progress Notes (Signed)
Pt's vitals WNL, nausea and pain is under control and is tolerating diet. Discussed d/c instructions with patient and answered all questions. Discharged to home.

## 2015-01-26 NOTE — Progress Notes (Signed)
General Surgery Note  LOS: 2 days  POD -  2 Days Post-Op  Assessment/Plan: 1.  LAPAROSCOPIC GASTRIC SLEEVE RESECTION UPPER GI ENDOSCOPY - 01/24/2015 - Andrey Campanile  Morbid obesity - weight - 327, BMI - 48.29  Swallow okay.  Labs okay.  Has vomited several times, but doing better now.  Will try protein drinks.  Will see if he is ready to go home today or not.  2.  OSA 3.  HTN 4.  GERD 5.  DVT prophylaxis - Lovenox   Principal Problem:   Obesity, Class III, BMI 40-49.9 (morbid obesity) (HCC) Active Problems:   Mild obstructive sleep apnea   Prediabetes   Fatty liver disease, nonalcoholic   Elevated blood pressure  Subjective:  Vomited several times.  Though over the last 2 hours, feels much better.  Mother in room Objective:   Filed Vitals:   01/26/15 0203 01/26/15 0558  BP: 114/53 137/86  Pulse: 77 74  Temp: 99.1 F (37.3 C) 98.2 F (36.8 C)  Resp: 18 18     Intake/Output from previous day:  11/23 0701 - 11/24 0700 In: 2354.2 [I.V.:2354.2] Out: 850 [Urine:850]  Intake/Output this shift:      Physical Exam:   General: Obese WM who is alert and oriented.    HEENT: Normal. Pupils equal. .   Lungs: Clear   Abdomen: Soft, rare BS   Wound: Clean   Lab Results:    Recent Labs  01/25/15 0510 01/25/15 1700 01/26/15 0510  WBC 10.1  --  9.1  HGB 13.4 13.2 13.3  HCT 40.7 39.5 39.3  PLT 287  --  231    BMET   Recent Labs  01/24/15 2130 01/25/15 0510  NA  --  137  K  --  4.5  CL  --  101  CO2  --  27  GLUCOSE  --  129*  BUN  --  15  CREATININE 1.21 1.01  CALCIUM  --  9.5    PT/INR  No results for input(s): LABPROT, INR in the last 72 hours.  ABG  No results for input(s): PHART, HCO3 in the last 72 hours.  Invalid input(s): PCO2, PO2   Studies/Results:  Dg Ugi W/water Sol Cm  01/25/2015  CLINICAL DATA:  Status post gastric sleeve procedure. EXAM: WATER SOLUBLE UPPER GI SERIES TECHNIQUE: Single-column upper GI series was performed using water soluble  contrast. CONTRAST:  50mL OMNIPAQUE IOHEXOL 300 MG/ML  SOLN COMPARISON:  09/27/2014. FLUOROSCOPY TIME:  Radiation Exposure Index (as provided by the fluoroscopic device): If the device does not provide the exposure index: Fluoroscopy Time (in minutes and seconds): 0 minutes and 24 seconds. Number of Acquired Images: FINDINGS: Patient was given small volume water-soluble contrast by mouth. Contrast material passes readily through the gastric sleeve, exiting the stomach through a normal-appearing pylorus. No evidence for contrast leak at the suture line. No duodenum dilatation although peristalsis in the duodenum is somewhat sluggish. IMPRESSION: Expected postoperative appearance in this patient status post gastric sleeve for section. Specifically, no evidence for gastric outlet obstruction or contrast leak. Electronically Signed   By: Kennith Center M.D.   On: 01/25/2015 10:44     Anti-infectives:   Anti-infectives    Start     Dose/Rate Route Frequency Ordered Stop   01/25/15 0600  cefoTEtan in Dextrose 5% (CEFOTAN) IVPB 2 g     2 g Intravenous On call to O.R. 01/24/15 1202 01/24/15 1415      Ovidio Kin, MD, FACS  Pager: [phone] [facility]  [facility] Office: [phone] 01/26/2015

## 2015-01-26 NOTE — Discharge Summary (Signed)
Physician Discharge Summary  Patient ID:  Charles Georgeyler W Noren  MRN: [id number]  DOB/AGE: 11-Nov-1995 19 y.o.  Admit date: 01/24/2015 Discharge date: 01/26/2015  Discharge Diagnoses:   Principal Problem:   Obesity, Class III, BMI 40-49.9 (morbid obesity) (HCC) Active Problems:   Mild obstructive sleep apnea   Prediabetes   Fatty liver disease, nonalcoholic   Elevated blood pressure   Operation: Procedure(s): LAPAROSCOPIC GASTRIC SLEEVE RESECTION UPPER GI ENDOSCOPY on 01/24/2015 Charles Morton  Discharged Condition: good  Hospital Course: Charles Morton is an 19 y.o. male whose primary care physician is Neldon LabellaMILLER,LISA LYNN, MD and who was admitted 01/24/2015 with a chief complaint of morbid obesity.   He was brought to the operating room on 01/24/2015 and underwent  LAPAROSCOPIC GASTRIC SLEEVE RESECTION UPPER GI ENDOSCOPY.  His swallow the first post op day looked good.  But he had trouble with nausea until this AM.  His nausea appears to have cleared and he is doing much better.  He is going to stay until early afternoon to make sure all is well, but then he can go home. His mother is in the room with him.  He is ready to go home.   The discharge instructions were reviewed with the patient.  Consults: None  Significant Diagnostic Studies: Results for orders placed or performed during the hospital encounter of 01/24/15  Glucose, capillary  Result Value Ref Range   Glucose-Capillary 92 65 - 99 mg/dL  Hemoglobin and hematocrit, blood  Result Value Ref Range   Hemoglobin 14.1 13.0 - 17.0 g/dL   HCT 62.142.0 30.839.0 - 65.752.0 %  CBC WITH DIFFERENTIAL  Result Value Ref Range   WBC 10.1 4.0 - 10.5 K/uL   RBC 4.47 4.22 - 5.81 MIL/uL   Hemoglobin 13.4 13.0 - 17.0 g/dL   HCT 84.640.7 96.239.0 - 95.252.0 %   MCV 91.1 78.0 - 100.0 fL   MCH 30.0 26.0 - 34.0 pg   MCHC 32.9 30.0 - 36.0 g/dL   RDW 84.112.7 32.411.5 - 40.115.5 %   Platelets 287 150 - 400 K/uL   Neutrophils Relative % 81 %   Neutro Abs 8.3 (H) 1.7 - 7.7 K/uL   Lymphocytes Relative 14 %   Lymphs Abs 1.4 0.7 - 4.0 K/uL   Monocytes Relative 5 %   Monocytes Absolute 0.5 0.1 - 1.0 K/uL   Eosinophils Relative 0 %   Eosinophils Absolute 0.0 0.0 - 0.7 K/uL   Basophils Relative 0 %   Basophils Absolute 0.0 0.0 - 0.1 K/uL  Creatinine, serum  Result Value Ref Range   Creatinine, Ser 1.21 0.61 - 1.24 mg/dL   GFR calc non Af Amer >60 >60 mL/min   GFR calc Af Amer >60 >60 mL/min  Comprehensive metabolic panel  Result Value Ref Range   Sodium 137 135 - 145 mmol/L   Potassium 4.5 3.5 - 5.1 mmol/L   Chloride 101 101 - 111 mmol/L   CO2 27 22 - 32 mmol/L   Glucose, Bld 129 (H) 65 - 99 mg/dL   BUN 15 6 - 20 mg/dL   Creatinine, Ser 0.271.01 0.61 - 1.24 mg/dL   Calcium 9.5 8.9 - 25.310.3 mg/dL   Total Protein 7.5 6.5 - 8.1 g/dL   Albumin 4.5 3.5 - 5.0 g/dL   AST 90 (H) 15 - 41 U/L   ALT 156 (H) 17 - 63 U/L   Alkaline Phosphatase 54 38 - 126 U/L   Total Bilirubin 0.8 0.3 - 1.2 mg/dL  GFR calc non Af Amer >60 >60 mL/min   GFR calc Af Amer >60 >60 mL/min   Anion gap 9 5 - 15  Glucose, capillary  Result Value Ref Range   Glucose-Capillary 135 (H) 65 - 99 mg/dL  Hemoglobin and hematocrit, blood  Result Value Ref Range   Hemoglobin 13.2 13.0 - 17.0 g/dL   HCT 46.9 62.9 - 52.8 %  Glucose, capillary  Result Value Ref Range   Glucose-Capillary 128 (H) 65 - 99 mg/dL  Glucose, capillary  Result Value Ref Range   Glucose-Capillary 130 (H) 65 - 99 mg/dL  Glucose, capillary  Result Value Ref Range   Glucose-Capillary 98 65 - 99 mg/dL  CBC with Differential  Result Value Ref Range   WBC 9.1 4.0 - 10.5 K/uL   RBC 4.34 4.22 - 5.81 MIL/uL   Hemoglobin 13.3 13.0 - 17.0 g/dL   HCT 41.3 24.4 - 01.0 %   MCV 90.6 78.0 - 100.0 fL   MCH 30.6 26.0 - 34.0 pg   MCHC 33.8 30.0 - 36.0 g/dL   RDW 27.2 53.6 - 64.4 %   Platelets 231 150 - 400 K/uL   Neutrophils Relative % 65 %   Neutro Abs 5.8 1.7 - 7.7 K/uL   Lymphocytes Relative 26 %   Lymphs Abs 2.4 0.7 - 4.0 K/uL    Monocytes Relative 9 %   Monocytes Absolute 0.9 0.1 - 1.0 K/uL   Eosinophils Relative 0 %   Eosinophils Absolute 0.0 0.0 - 0.7 K/uL   Basophils Relative 0 %   Basophils Absolute 0.0 0.0 - 0.1 K/uL    Dg Ugi W/water Sol Cm  01/25/2015  CLINICAL DATA:  Status post gastric sleeve procedure. EXAM: WATER SOLUBLE UPPER GI SERIES TECHNIQUE: Single-column upper GI series was performed using water soluble contrast. CONTRAST:  50mL OMNIPAQUE IOHEXOL 300 MG/ML  SOLN COMPARISON:  09/27/2014. FLUOROSCOPY TIME:  Radiation Exposure Index (as provided by the fluoroscopic device): If the device does not provide the exposure index: Fluoroscopy Time (in minutes and seconds): 0 minutes and 24 seconds. Number of Acquired Images: FINDINGS: Patient was given small volume water-soluble contrast by mouth. Contrast material passes readily through the gastric sleeve, exiting the stomach through a normal-appearing pylorus. No evidence for contrast leak at the suture line. No duodenum dilatation although peristalsis in the duodenum is somewhat sluggish. IMPRESSION: Expected postoperative appearance in this patient status post gastric sleeve for section. Specifically, no evidence for gastric outlet obstruction or contrast leak. Electronically Signed   By: Kennith Center M.D.   On: 01/25/2015 10:44    Discharge Exam:  Filed Vitals:   01/26/15 0558 01/26/15 0941  BP: 137/86 132/74  Pulse: 74 83  Temp: 98.2 F (36.8 C) 98.5 F (36.9 C)  Resp: 18 20    General: Obese WM who is young and who is alert. Lungs: Clear to auscultation and symmetric breath sounds. Heart:  RRR. No murmur or rub. Abdomen: Soft. No mass.  Normal bowel sounds.  Incisions are okay.  Discharge Medications:     Medication List    TAKE these medications        cetirizine 10 MG tablet  Commonly known as:  ZYRTEC  Take 10 mg by mouth daily.     cloNIDine 0.2 MG tablet  Commonly known as:  CATAPRES  Take 0.2 mg by mouth daily.  Notes to  Patient:  Monitor Blood Pressure Daily and keep a log for primary care physician.  You may need  to make changes to your medications with rapid weight loss.        FLUoxetine 20 MG capsule  Commonly known as:  PROZAC  Take 20 mg by mouth daily.     rizatriptan 10 MG tablet  Commonly known as:  MAXALT  Take 10 mg by mouth 2 (two) times daily as needed for migraine. Take for migraine, may repeat dose in 2 hours.        Disposition: 01-Home or Self Care      Discharge Instructions    Ambulate hourly while awake    Complete by:  As directed      Call MD for:  difficulty breathing, headache or visual disturbances    Complete by:  As directed      Call MD for:  persistant dizziness or light-headedness    Complete by:  As directed      Call MD for:  persistant nausea and vomiting    Complete by:  As directed      Call MD for:  redness, tenderness, or signs of infection (pain, swelling, redness, odor or green/yellow discharge around incision site)    Complete by:  As directed      Call MD for:  severe uncontrolled pain    Complete by:  As directed      Call MD for:  temperature >101 F    Complete by:  As directed      Diet bariatric full liquid    Complete by:  As directed      Incentive spirometry    Complete by:  As directed   Perform hourly while awake           Follow-up Information    Follow up with Atilano Ina, MD. Go on 02/02/2015.   Specialty:  General Surgery   Why:  For Post-Op Check at 4:30   Contact information:   [address] [place] [address] [phone]       Follow up with Atilano Ina, MD. Go on 03/02/2015.   Specialty:  General Surgery   Why:  For Post-Op Check at 2:45   Contact information:   [address] [place] [address] [phone]        Signed: Ovidio Kin, M.D., [facility] Surgery Office:  [phone]  01/26/2015, 10:53 AM

## 2015-01-30 ENCOUNTER — Telehealth (HOSPITAL_COMMUNITY): Payer: Self-pay

## 2015-01-30 NOTE — Telephone Encounter (Signed)

## 2015-02-07 ENCOUNTER — Ambulatory Visit: Payer: 59

## 2015-08-03 ENCOUNTER — Encounter (HOSPITAL_COMMUNITY): Payer: Self-pay | Admitting: Emergency Medicine

## 2015-08-03 ENCOUNTER — Emergency Department (HOSPITAL_COMMUNITY)
Admission: EM | Admit: 2015-08-03 | Discharge: 2015-08-04 | Disposition: A | Discharge status: Other Acute Inpatient Hospital | Payer: 59 | Attending: Emergency Medicine | Admitting: Emergency Medicine

## 2015-08-03 DIAGNOSIS — E119 Type 2 diabetes mellitus without complications: Secondary | ICD-10-CM | POA: Diagnosis not present

## 2015-08-03 DIAGNOSIS — T43502A Poisoning by unspecified antipsychotics and neuroleptics, intentional self-harm, initial encounter: Secondary | ICD-10-CM | POA: Diagnosis present

## 2015-08-03 DIAGNOSIS — J45909 Unspecified asthma, uncomplicated: Secondary | ICD-10-CM | POA: Diagnosis not present

## 2015-08-03 DIAGNOSIS — T424X4A Poisoning by benzodiazepines, undetermined, initial encounter: Secondary | ICD-10-CM | POA: Diagnosis not present

## 2015-08-03 DIAGNOSIS — F332 Major depressive disorder, recurrent severe without psychotic features: Secondary | ICD-10-CM | POA: Diagnosis present

## 2015-08-03 DIAGNOSIS — I10 Essential (primary) hypertension: Secondary | ICD-10-CM | POA: Insufficient documentation

## 2015-08-03 DIAGNOSIS — F1410 Cocaine abuse, uncomplicated: Secondary | ICD-10-CM | POA: Diagnosis present

## 2015-08-03 DIAGNOSIS — Z79899 Other long term (current) drug therapy: Secondary | ICD-10-CM | POA: Insufficient documentation

## 2015-08-03 LAB — COMPREHENSIVE METABOLIC PANEL
ALBUMIN: 4.6 g/dL (ref 3.5–5.0)
ALT: 34 U/L (ref 17–63)
ANION GAP: 8 (ref 5–15)
AST: 29 U/L (ref 15–41)
Alkaline Phosphatase: 59 U/L (ref 38–126)
BILIRUBIN TOTAL: 0.6 mg/dL (ref 0.3–1.2)
BUN: 13 mg/dL (ref 6–20)
CHLORIDE: 105 mmol/L (ref 101–111)
CO2: 24 mmol/L (ref 22–32)
Calcium: 9.6 mg/dL (ref 8.9–10.3)
Creatinine, Ser: 1.18 mg/dL (ref 0.61–1.24)
GFR calc Af Amer: >60 mL/min (ref >60)
GFR calc non Af Amer: >60 mL/min (ref >60)
GLUCOSE: 96 mg/dL (ref 65–99)
POTASSIUM: 4.2 mmol/L (ref 3.5–5.1)
SODIUM: 137 mmol/L (ref 135–145)
Total Protein: 7.6 g/dL (ref 6.5–8.1)

## 2015-08-03 LAB — CBC
HEMATOCRIT: 42.3 % (ref 39.0–52.0)
HEMOGLOBIN: 14.9 g/dL (ref 13.0–17.0)
MCH: 31.2 pg (ref 26.0–34.0)
MCHC: 35.2 g/dL (ref 30.0–36.0)
MCV: 88.5 fL (ref 78.0–100.0)
Platelets: 274 10*3/uL (ref 150–400)
RBC: 4.78 MIL/uL (ref 4.22–5.81)
RDW: 12.4 % (ref 11.5–15.5)
WBC: 12.7 10*3/uL — AB (ref 4.0–10.5)

## 2015-08-03 LAB — RAPID URINE DRUG SCREEN, HOSP PERFORMED
AMPHETAMINES: NOT DETECTED
BARBITURATES: NOT DETECTED
BENZODIAZEPINES: POSITIVE — AB
COCAINE: POSITIVE — AB
Opiates: NOT DETECTED
Tetrahydrocannabinol: NOT DETECTED

## 2015-08-03 LAB — ACETAMINOPHEN LEVEL

## 2015-08-03 LAB — CBG MONITORING, ED: GLUCOSE-CAPILLARY: 92 mg/dL (ref 65–99)

## 2015-08-03 LAB — SALICYLATE LEVEL: Salicylate Lvl: <4 mg/dL (ref 2.8–30.0)

## 2015-08-03 LAB — ETHANOL: Alcohol, Ethyl (B): <5 mg/dL (ref <5)

## 2015-08-03 MED ORDER — ALUM & MAG HYDROXIDE-SIMETH 200-200-20 MG/5ML PO SUSP: 30.0000 mL | ORAL | Status: DC | PRN

## 2015-08-03 MED ORDER — NICOTINE 21 MG/24HR TD PT24
21.0000 mg | MEDICATED_PATCH | Freq: Every day | TRANSDERMAL | Status: DC
Start: 2015-08-03 — End: 2015-08-04
  Administered 2015-08-03 – 2015-08-04 (×2): 21 mg via TRANSDERMAL
  Filled 2015-08-03 (×2): qty 1

## 2015-08-03 MED ORDER — FLUOXETINE HCL 20 MG PO CAPS
20.0000 mg | ORAL_CAPSULE | Freq: Every day | ORAL | Status: DC
  Administered 2015-08-04: 20 mg via ORAL
  Filled 2015-08-03: qty 1

## 2015-08-03 MED ORDER — DIPHENHYDRAMINE HCL 50 MG/ML IJ SOLN
50.0000 mg | Freq: Once | INTRAMUSCULAR | Status: AC
  Administered 2015-08-03: 50 mg via INTRAMUSCULAR
  Filled 2015-08-03: qty 1

## 2015-08-03 MED ORDER — AMPHETAMINE-DEXTROAMPHETAMINE 20 MG PO TABS: 20.0000 mg | ORAL_TABLET | Freq: Every day | ORAL | Status: DC

## 2015-08-03 MED ORDER — LORAZEPAM 2 MG/ML IJ SOLN
2.0000 mg | Freq: Once | INTRAMUSCULAR | Status: AC
  Administered 2015-08-03: 2 mg via INTRAMUSCULAR
  Filled 2015-08-03: qty 1

## 2015-08-03 MED ORDER — IBUPROFEN 200 MG PO TABS: 600.0000 mg | ORAL_TABLET | Freq: Three times a day (TID) | ORAL | Status: DC | PRN

## 2015-08-03 MED ORDER — ONDANSETRON HCL 4 MG PO TABS: 4.0000 mg | ORAL_TABLET | Freq: Three times a day (TID) | ORAL | Status: DC | PRN

## 2015-08-03 MED ORDER — LORATADINE 10 MG PO TABS
10.0000 mg | ORAL_TABLET | Freq: Every day | ORAL | Status: DC
  Administered 2015-08-04: 10 mg via ORAL
  Filled 2015-08-03: qty 1

## 2015-08-03 MED ORDER — ACETAMINOPHEN 325 MG PO TABS: 650.0000 mg | ORAL_TABLET | ORAL | Status: DC | PRN

## 2015-08-03 NOTE — ED Notes (Signed)
Poison Control contacted - Labs (Tylenol, Asprin), EKG, Watch for 6-8 hours for somnolence, CNS respiratory depression, IV fluids if hypertensive.

## 2015-08-03 NOTE — ED Notes (Signed)
Pt stated that he wants to go to the drunk tank because when he gets off of his xanax he gets very violent and angry. Pt states that he feels better if security or [employer] were with him because he doesn't want to snap on anybody. He said his anger level is at an eight on a scale of one through ten with ten being the most angry.

## 2015-08-03 NOTE — ED Notes (Signed)
Bed: [id number] Expected date: 08/03/15 Expected time: 10:06 PM Means of arrival:  Comments:

## 2015-08-03 NOTE — BH Assessment (Signed)
Unable to complete assessment at this time. Pt is sleeping and does not respond to this writer calling his name or tapping his shoulder. Will assess when pt is alert.

## 2015-08-03 NOTE — ED Notes (Signed)
Patient reporting increased anxiety and agitation. Pacing unit. States he feels claustrophobic, wants to be taken to jail.

## 2015-08-03 NOTE — ED Notes (Signed)
[employer] officer spoke with patient per request.

## 2015-08-03 NOTE — BH Assessment (Signed)
Assessment completed. Consulted Alberteen SamFran Hobson, NP who agrees that pt meets inpatient criteria. TTS to seek placement. Informed Dr. Freida BusmanAllen of the recommendation.

## 2015-08-03 NOTE — BH Assessment (Addendum)
Tele Assessment Note   Charles Morton is an 20 y.o. male presenting to [facility] after an overdose on Xanax. Pt stated "I took 6  Xanax bars". "I have never done this before". Pt reported that he broke up with his girlfriend after dating for 1  years. Pt denies that the overdose was a suicide attempt; however pt stated repeated "I wanted to take enough so that I could forget about all the physical pain I am in". "If I wanted to commit suicide there was shot gun, glock and AR next to the pill bottle".  Pt did not report any previous suicide attempts or self-injurious behaviors. Pt denies HI and AVH at this time. Pt reported that he is currently receiving medication management and reported that he would like his Adderall dosage increase so that it would last him throughout the day. Pt did not report a trauma history.  Inpatient treatment is recommended for safety and stabilization.   Diagnosis: Inpatient treatment   Past Medical History:  Past Medical History  Diagnosis Date  . Asthma   . Allergy   . ADD (attention deficit disorder)   . Pilonidal cyst   . Acne   . GERD (gastroesophageal reflux disease)   . Hypertension   . Sleep apnea     mild sleep apnea and no cpap   . Diabetes mellitus without complication (HCC)     prediabetic on no meds   . Anxiety     Past Surgical History  Procedure Laterality Date  . None    . Pilonidal cyst excision N/A 09/09/2013    Procedure: CYST EXCISION PILONIDAL EXTENSIVE;  Surgeon: Shelly Rubenstein, MD;  Location: [facility] ORS;  Service: General;  Laterality: N/A;  . Laparoscopic gastric sleeve resection N/A 01/24/2015    Procedure: LAPAROSCOPIC GASTRIC SLEEVE RESECTION;  Surgeon: Gaynelle Adu, MD;  Location: [facility] ORS;  Service: General;  Laterality: N/A;  . Upper gi endoscopy  01/24/2015    Procedure: UPPER GI ENDOSCOPY;  Surgeon: Gaynelle Adu, MD;  Location: [facility] ORS;  Service: General;;    Family History: History reviewed. No pertinent family history.  Social  History:  reports that he has never smoked. He has never used smokeless tobacco. He reports that he does not drink alcohol or use illicit drugs.  Additional Social History:  Alcohol / Drug Use History of alcohol / drug use?: No history of alcohol / drug abuse  CIWA: CIWA-Ar BP: (!) 123/104 mmHg Pulse Rate: 85 COWS:    PATIENT STRENGTHS: (choose at least two) Average or above average intelligence Communication skills  Allergies:  Allergies  Allergen Reactions  . Buspar [Buspirone] Rash  . Depakote [Divalproex Sodium] Rash  . Intuniv [Guanfacine Hcl] Rash    Home Medications:  (Not in a hospital admission)  OB/GYN Status:  No LMP for male patient.  General Assessment Data Location of Assessment: [facility] ED Is this a Tele or Face-to-Face Assessment?: Face-to-Face Is this an Initial Assessment or a Re-assessment for this encounter?: Initial Assessment Marital status: Single Living Arrangements: Parent Can pt return to current living arrangement?: Yes Admission Status: Voluntary Is patient capable of signing voluntary admission?: Yes Referral Source: Self/Family/Friend Insurance type: [facility]      Crisis Care Plan Living Arrangements: Parent Name of Psychiatrist: Oneta Rack, ANP, GNP, BC Name of Therapist: None   Education Status Is patient currently in school?: No  Risk to self with the past 6 months Suicidal Ideation: No Has patient been a risk to self within  the past 6 months prior to admission? : Yes (Pt took 6 Xanax bars but denies SI) Suicidal Intent: No Has patient had any suicidal intent within the past 6 months prior to admission? : No Is patient at risk for suicide?: No Suicidal Plan?: No-Not Currently/Within Last 6 Months Has patient had any suicidal plan within the past 6 months prior to admission? : Other (comment) Access to Means: No What has been your use of drugs/alcohol within the last 12 months?: Pt denies  Previous Attempts/Gestures: No How many  times?: 0 Other Self Harm Risks: Pt denies  Triggers for Past Attempts: None known (No previous attempts reported) Intentional Self Injurious Behavior: None Family Suicide History: No Recent stressful life event(s): Other (Comment) (Break up ) Persecutory voices/beliefs?: No Depression: Yes Depression Symptoms: Despondent, Tearfulness, Isolating, Fatigue, Feeling angry/irritable Substance abuse history and/or treatment for substance abuse?: No  Risk to Others within the past 6 months Homicidal Ideation: No Does patient have any lifetime risk of violence toward others beyond the six months prior to admission? : No Thoughts of Harm to Others: No Current Homicidal Intent: No Current Homicidal Plan: No Access to Homicidal Means: No Identified Victim: N/A History of harm to others?: No Assessment of Violence: None Noted Violent Behavior Description: No violent behaviors observed. Pt is calm and cooperative at this time.  Does patient have access to weapons?: Yes (Comment) (Shot gun, glock, AR) Criminal Charges Pending?: No Does patient have a court date: No Is patient on probation?: No  Psychosis Hallucinations: None noted Delusions: None noted  Mental Status Report Appearance/Hygiene: In scrubs Eye Contact: Good Motor Activity: Freedom of movement Speech: Slurred Level of Consciousness: Quiet/awake Mood: Anxious Affect: Anxious Anxiety Level: Moderate Thought Processes: Coherent, Relevant Judgement: Partial Orientation: Person, Place, Time, Situation Obsessive Compulsive Thoughts/Behaviors: None  Cognitive Functioning Concentration: Fair Memory: Recent Intact, Remote Intact IQ: Average Insight: Poor Impulse Control: Poor Appetite: Good Weight Loss: 0 Weight Gain: 0 Sleep: No Change Total Hours of Sleep: 10 Vegetative Symptoms: None  ADLScreening [facility] Assessment Services) Patient's cognitive ability adequate to safely complete daily activities?: Yes Patient able  to express need for assistance with ADLs?: Yes Independently performs ADLs?: Yes (appropriate for developmental age)  Prior Inpatient Therapy Prior Inpatient Therapy: Yes Prior Therapy Dates: 2009 Prior Therapy Facilty/Provider(s): [facility] Reason for Treatment: depression  Prior Outpatient Therapy Prior Outpatient Therapy: Yes Prior Therapy Dates: Current  Prior [facility]ilty[facility]Provider([facility]  Reason for Treatment: Medication management  Does patient have an ACCT team?: No Does patient have Intensive In-House Services?  : No Does patient have [facility] services? : No Does patient have P4CC services?: No  ADL Screening (condition at time of admission) Patient's cognitive ability adequate to safely complete daily activities?: Yes Is the patient deaf or have difficulty hearing?: No Does the patient have difficulty seeing, even when wearing glasses/contacts?: No Does the patient have difficulty concentrating, remembering, or making decisions?: No Patient able to express need for assistance with ADLs?: Yes Does the patient have difficulty dressing or bathing?: No Independently performs ADLs?: Yes (appropriate for developmental age)       Abuse/Neglect Assessment (Assessment to be complete while patient is alone) Physical Abuse: Denies Verbal Abuse: Denies Sexual Abuse: Denies Exploitation of patient/patient's resources: Denies Self-Neglect: Denies     Merchant navy officer (For Healthcare) Does patient have an advance directive?: No Would patient like information on creating an advanced directive?: No - patient declined information    Additional Information 1:1 In  Past 12 Months?: Yes CIRT Risk: No Elopement Risk: No Does patient have medical clearance?: No     Disposition: Inpatient   Disposition Initial Assessment Completed for this Encounter: Yes  Donat Humble S 08/03/2015 10:29 PM

## 2015-08-03 NOTE — ED Notes (Signed)
Charles Morton with Poison Control called for follow up on the patient. He has been cleared with her department.

## 2015-08-03 NOTE — ED Notes (Signed)
Patient brought in by EMS with complaints of OD. Took a total of 78mg  Xanax. Vomited 20min after ingestion. SI, girlfriend broke up with him today. First attempt.

## 2015-08-03 NOTE — ED Provider Notes (Signed)
CSN: [id number]     Arrival date & time 08/03/15  1747 History   First MD Initiated Contact with Patient 08/03/15 1834     Chief Complaint  Patient presents with  . Drug Overdose  . Suicidal     (Consider location/radiation/quality/duration/timing/severity/associated sxs/prior Treatment) HPI Comments: Patient here after taking an intentional overdose of Xanax prior to arrival.  With his girlfriend today and became upset at this. Had emesis shortly afterwards and is unsure if he saw pill fragments. Denies any concurrent use of alcohol. No other illicit drug use. Does have a history of prior [facility] to [facility]. Denies being sleepy. Denies responding to internal stimuli. No hallucinations noted. Denies any homicidal ideations. States he is not currently suicidal at this time. States that this was just a stress reaction and that he just wanted to go to sleep  Patient is a 20 y.o. male presenting with Overdose. The history is provided by the patient.  Drug Overdose    Past Medical History  Diagnosis Date  . Asthma   . Allergy   . ADD (attention deficit disorder)   . Pilonidal cyst   . Acne   . GERD (gastroesophageal reflux disease)   . Hypertension   . Sleep apnea     mild sleep apnea and no cpap   . Diabetes mellitus without complication (HCC)     prediabetic on no meds   . Anxiety    Past Surgical History  Procedure Laterality Date  . None    . Pilonidal cyst excision N/A 09/09/2013    Procedure: CYST EXCISION PILONIDAL EXTENSIVE;  Surgeon: Shelly Rubensteinouglas A Blackman, MD;  Location: [facility];  Service: General;  Laterality: N/A;  . Laparoscopic gastric sleeve resection N/A 01/24/2015    Procedure: LAPAROSCOPIC GASTRIC SLEEVE RESECTION;  Surgeon: Gaynelle AduEric [name], MD;  Location: [facility];  Service: General;  Laterality: N/A;  . Upper gi endoscopy  01/24/2015    Procedure: UPPER GI ENDOSCOPY;  Surgeon: Gaynelle AduEric [name], MD;  Location: [facility];  Service: General;;   History reviewed.  No pertinent family history. Social History  Substance Use Topics  . Smoking status: Never Smoker   . Smokeless tobacco: Never Used  . Alcohol Use: No    Review of Systems  All other systems reviewed and are negative.     Allergies  Buspar; Depakote; and Intuniv  Home Medications   Prior to Admission medications   Medication Sig Start Date End Date Taking? Authorizing Provider  amphetamine-dextroamphetamine (ADDERALL) 20 MG tablet Take 20 mg by mouth daily. 07/26/15  Yes Historical Provider, MD  cetirizine (ZYRTEC) 10 MG tablet Take 10 mg by mouth daily.   Yes Historical Provider, MD  FLUoxetine (PROZAC) 20 MG capsule Take 20 mg by mouth daily.   Yes Historical Provider, MD  rizatriptan (MAXALT) 10 MG tablet Take 10 mg by mouth 2 (two) times daily as needed for migraine. Take for migraine, may repeat dose in 2 hours. 01/07/15   Historical Provider, MD   BP 108/63 mmHg  Pulse 85  Temp(Src) 98 F (36.7 C) (Oral)  Resp 25  Wt 141.976 kg  SpO2 96% Physical Exam  Constitutional: He is oriented to person, place, and time. He appears well-developed and well-nourished.  Non-toxic appearance. No distress.  HENT:  Head: Normocephalic and atraumatic.  Eyes: Conjunctivae, EOM and lids are normal. Pupils are equal, round, and reactive to light.  Neck: Normal range of motion. Neck supple. No tracheal deviation present. No thyroid mass present.  Cardiovascular: Normal rate, regular rhythm and normal heart sounds.  Exam reveals no gallop.   No murmur heard. Pulmonary/Chest: Effort normal and breath sounds normal. No stridor. No respiratory distress. He has no decreased breath sounds. He has no wheezes. He has no rhonchi. He has no rales.  Abdominal: Soft. Normal appearance and bowel sounds are normal. He exhibits no distension. There is no tenderness. There is no rebound and no CVA tenderness.  Musculoskeletal: Normal range of motion. He exhibits no edema or tenderness.  Neurological: He is  alert and oriented to person, place, and time. He has normal strength. No cranial nerve deficit or sensory deficit. GCS eye subscore is 4. GCS verbal subscore is 5. GCS motor subscore is 6.  Skin: Skin is warm and dry. No abrasion and no rash noted.  Psychiatric: His affect is blunt. His speech is delayed. He is slowed. He expresses no homicidal ideation. He expresses no suicidal plans and no homicidal plans.  Nursing note and vitals reviewed.   ED Course  Procedures (including critical care time) Labs Review Labs Reviewed  ACETAMINOPHEN LEVEL - Abnormal; Notable for the following:    Acetaminophen (Tylenol), Serum <10 (*)    All other components within normal limits  CBC - Abnormal; Notable for the following:    WBC 12.7 (*)    All other components within normal limits  URINE RAPID DRUG SCREEN, HOSP PERFORMED - Abnormal; Notable for the following:    Cocaine POSITIVE (*)    Benzodiazepines POSITIVE (*)    All other components within normal limits  COMPREHENSIVE METABOLIC PANEL  ETHANOL  SALICYLATE LEVEL  CBG MONITORING, ED    Imaging Review No results found. I have personally reviewed and evaluated these images and lab results as part of my medical decision-making.   EKG Interpretation   Date/Time:  Thursday August 03 2015 19:09:22 EDT Ventricular Rate:  82 PR Interval:  147 QRS Duration: 92 QT Interval:  350 QTC Calculation: 409 R Axis:   46 Text Interpretation:  Sinus rhythm Borderline Q waves in inferior leads No  significant change since last tracing Confirmed by Jasyah Theurer  MD, Cathyrn Deas  ([id number]) on 08/03/2015 7:44:07 PM      MDM   Final diagnoses:  None    Patient monitored here and no signs of sedation. Medically cleared and will be dispositioned by psychiatry    Lorre Nick, MD 08/03/15 2114

## 2015-08-03 NOTE — ED Notes (Signed)
Mother, who is a Veterinary surgeonheriff, stated "he did throw up.  Not sure if there were pill fragments."

## 2015-08-03 NOTE — ED Notes (Signed)
Patient appears drowsy, cooperative. Denies SI, HI, AVH. Reports anxiety related to being in the hospital. States he has had feelings of sadness, depression for 3 days since breakup with girlfriend.   Patient does not take Prozac or Adderall at night. States he does not need Claritin at present.  Encouragement offered. Oriented to the unit.  Q 15 safety checks in place.

## 2015-08-03 NOTE — ED Notes (Signed)
Attempted IV access x 1 without success.  Mother stated "when he had surgery last time, it took 5 of them to get it started."

## 2015-08-03 NOTE — ED Notes (Signed)
Pt is able to ambulate without difficulty

## 2015-08-03 NOTE — ED Notes (Signed)
Mother also stated "he was dx'd as bipolar & something else when he was younger."

## 2015-08-04 ENCOUNTER — Inpatient Hospital Stay (HOSPITAL_COMMUNITY)
Admission: AD | Admit: 2015-08-04 | Discharge: 2015-08-08 | DRG: 885 | Disposition: A | Discharge status: Home / Self Care | Payer: 59 | Source: Intra-hospital | Attending: Psychiatry | Admitting: Psychiatry

## 2015-08-04 ENCOUNTER — Encounter (HOSPITAL_COMMUNITY): Payer: Self-pay

## 2015-08-04 DIAGNOSIS — K219 Gastro-esophageal reflux disease without esophagitis: Secondary | ICD-10-CM | POA: Diagnosis present

## 2015-08-04 DIAGNOSIS — K760 Fatty (change of) liver, not elsewhere classified: Secondary | ICD-10-CM | POA: Diagnosis present

## 2015-08-04 DIAGNOSIS — T424X4A Poisoning by benzodiazepines, undetermined, initial encounter: Secondary | ICD-10-CM

## 2015-08-04 DIAGNOSIS — F329 Major depressive disorder, single episode, unspecified: Secondary | ICD-10-CM | POA: Insufficient documentation

## 2015-08-04 DIAGNOSIS — I10 Essential (primary) hypertension: Secondary | ICD-10-CM | POA: Diagnosis present

## 2015-08-04 DIAGNOSIS — R45851 Suicidal ideations: Secondary | ICD-10-CM | POA: Diagnosis present

## 2015-08-04 DIAGNOSIS — G4733 Obstructive sleep apnea (adult) (pediatric): Secondary | ICD-10-CM | POA: Diagnosis present

## 2015-08-04 DIAGNOSIS — R7303 Prediabetes: Secondary | ICD-10-CM | POA: Diagnosis present

## 2015-08-04 DIAGNOSIS — F332 Major depressive disorder, recurrent severe without psychotic features: Secondary | ICD-10-CM | POA: Diagnosis present

## 2015-08-04 DIAGNOSIS — F419 Anxiety disorder, unspecified: Secondary | ICD-10-CM | POA: Diagnosis present

## 2015-08-04 DIAGNOSIS — F1994 Other psychoactive substance use, unspecified with psychoactive substance-induced mood disorder: Secondary | ICD-10-CM | POA: Insufficient documentation

## 2015-08-04 DIAGNOSIS — F17210 Nicotine dependence, cigarettes, uncomplicated: Secondary | ICD-10-CM | POA: Diagnosis present

## 2015-08-04 DIAGNOSIS — F1410 Cocaine abuse, uncomplicated: Secondary | ICD-10-CM

## 2015-08-04 DIAGNOSIS — T43502A Poisoning by unspecified antipsychotics and neuroleptics, intentional self-harm, initial encounter: Secondary | ICD-10-CM | POA: Diagnosis not present

## 2015-08-04 DIAGNOSIS — F1910 Other psychoactive substance abuse, uncomplicated: Secondary | ICD-10-CM | POA: Insufficient documentation

## 2015-08-04 DIAGNOSIS — J45909 Unspecified asthma, uncomplicated: Secondary | ICD-10-CM | POA: Diagnosis present

## 2015-08-04 DIAGNOSIS — F32A Depression, unspecified: Secondary | ICD-10-CM | POA: Insufficient documentation

## 2015-08-04 MED ORDER — MAGNESIUM HYDROXIDE 400 MG/5ML PO SUSP: 30.0000 mL | Freq: Every day | ORAL | Status: DC | PRN

## 2015-08-04 MED ORDER — ACETAMINOPHEN 325 MG PO TABS: 650.0000 mg | ORAL_TABLET | Freq: Four times a day (QID) | ORAL | Status: DC | PRN

## 2015-08-04 MED ORDER — ALUM & MAG HYDROXIDE-SIMETH 200-200-20 MG/5ML PO SUSP: 30.0000 mL | ORAL | Status: DC | PRN

## 2015-08-04 MED ORDER — LORATADINE 10 MG PO TABS
10.0000 mg | ORAL_TABLET | Freq: Every day | ORAL | Status: DC
  Administered 2015-08-07: 10 mg via ORAL
  Filled 2015-08-04 (×5): qty 1

## 2015-08-04 MED ORDER — FLUOXETINE HCL 20 MG PO CAPS
20.0000 mg | ORAL_CAPSULE | Freq: Every day | ORAL | Status: DC
  Administered 2015-08-07 – 2015-08-08 (×2): 20 mg via ORAL
  Filled 2015-08-04 (×5): qty 1

## 2015-08-04 NOTE — Tx Team (Signed)
Initial Interdisciplinary Treatment Plan   PATIENT STRESSORS: Loss of significant relationship Marital or family conflict Medication change or noncompliance Substance abuse   PATIENT STRENGTHS: Ability for insight Active sense of humor Average or above average intelligence Communication skills Supportive family/friends   PROBLEM LIST: Problem List/Patient Goals Date to be addressed Date deferred Reason deferred Estimated date of resolution  "Anxiety" 08/04/15     "Need mood stabilizer" 08/04/15     "my weight" 08/04/15     Depression 08/04/15     Substance Abuse 08/04/15                              DISCHARGE CRITERIA:  Ability to meet basic life and health needs Adequate post-discharge living arrangements Motivation to continue treatment in a less acute level of care Verbal commitment to aftercare and medication compliance  PRELIMINARY DISCHARGE PLAN: Attend aftercare/continuing care group Outpatient therapy Return to previous living arrangement  PATIENT/FAMIILY INVOLVEMENT: This treatment plan has been presented to and reviewed with the patient, Charles Morton.  The patient and family have been given the opportunity to ask questions and make suggestions.  Mickie Baillizabeth O Iwenekha 08/04/2015, 4:26 PM

## 2015-08-04 NOTE — Consult Note (Signed)
[facility] Face-to-Face Psychiatry Consult   Reason for Consult:  Intentional overdose Referring Physician:  EDP Patient Identification: Charles Morton MRN:  [id number] Principal Diagnosis: Severe recurrent major depression without psychotic features [facility]) Diagnosis:   Patient Active Problem List   Diagnosis Date Noted  . Severe recurrent major depression without psychotic features ([place]) [F33.2] 08/04/2015    Priority: High  . Cocaine abuse [F14.10] 08/04/2015    Priority: High  . Mild obstructive sleep apnea [G47.33] 01/24/2015  . Obesity, Class III, BMI 40-49.9 (morbid obesity) ([place]) [E66.01] 01/24/2015  . Prediabetes [R73.03] 01/24/2015  . Fatty liver disease, nonalcoholic [Y86.5] 78/46/9629  . Elevated blood pressure [R03.0] 01/24/2015  . Pilonidal cyst with abscess [L05.01] 07/20/2013    Total Time spent with patient: 45 minutes  Subjective:   Charles Morton is a 20 y.o. male patient admitted with suicide attempt.  HPI:  On admission:  20 y.o. male presenting to [facility] after an overdose on Xanax. Pt stated "I took 6  Xanax bars". "I have never done this before". Pt reported that he broke up with his girlfriend after dating for 1  years. Pt denies that the overdose was a suicide attempt; however pt stated repeated "I wanted to take enough so that I could forget about all the physical pain I am in". "If I wanted to commit suicide there was shot gun, glock and AR next to the pill bottle". Pt did not report any previous suicide attempts or self-injurious behaviors. Pt denies HI and AVH at this time. Pt reported that he is currently receiving medication management and reported that he would like his Adderall dosage increase so that it would last him throughout the day. Pt did not report a trauma history.   Today:  Patient remains drowsy with depression and suicidal ideations.  Denies homicidal ideations, hallucinations, and alcohol abuse.  Reports using cocaine once and benzodiazepines on  occasion.  Past Psychiatric History: depress  Risk to Self: Suicidal Ideation: No Suicidal Intent: No Is patient at risk for suicide?: No Suicidal Plan?: No-Not Currently/Within Last 6 Months Access to Means: No What has been your use of drugs/alcohol within the last 12 months?: Pt denies  How many times?: 0 Other Self Harm Risks: Pt denies  Triggers for Past Attempts: None known (No previous attempts reported) Intentional Self Injurious Behavior: None Risk to Others: Homicidal Ideation: No Thoughts of Harm to Others: No Current Homicidal Intent: No Current Homicidal Plan: No Access to Homicidal Means: No Identified Victim: N/A History of harm to others?: No Assessment of Violence: None Noted Violent Behavior Description: No violent behaviors observed. Pt is calm and cooperative at this time.  Does patient have access to weapons?: Yes (Comment) (Shot gun, glock, [place]) Criminal Charges Pending?: No Does patient have a court date: No Prior Inpatient Therapy: Prior Inpatient Therapy: Yes Prior Therapy Dates: 2009 Prior Therapy Facilty/Provider([facility]): [facility] Reason for Treatment: depression Prior Outpatient Therapy: Prior Outpatient Therapy: Yes Prior Therapy Dates: Current  Prior Therapy Facilty/Provider(s): [facility]  Reason for Treatment: Medication management  Does patient have an ACCT team?: No Does patient have Intensive In-House Services?  : No Does patient have [facility] services? : No Does patient have P4CC services?: No  Past Medical History:  Past Medical History  Diagnosis Date  . Asthma   . Allergy   . ADD (attention deficit disorder)   . Pilonidal cyst   . Acne   . GERD (gastroesophageal reflux disease)   . Hypertension   .  Sleep apnea     mild sleep apnea and no cpap   . Diabetes mellitus without complication (HCC)     prediabetic on no meds   . Anxiety     Past Surgical History  Procedure Laterality Date  . None    . Pilonidal cyst  excision N/A 09/09/2013    Procedure: CYST EXCISION PILONIDAL EXTENSIVE;  Surgeon: Harl Bowie, MD;  Location: [facility];  Service: General;  Laterality: N/A;  . Laparoscopic gastric sleeve resection N/A 01/24/2015    Procedure: LAPAROSCOPIC GASTRIC SLEEVE RESECTION;  Surgeon: Greer Pickerel, MD;  Location: [facility];  Service: General;  Laterality: N/A;  . Upper gi endoscopy  01/24/2015    Procedure: UPPER GI ENDOSCOPY;  Surgeon: Greer Pickerel, MD;  Location: [facility];  Service: General;;   Family History: History reviewed. No pertinent family history. Family Psychiatric  History: none Social History:  History  Alcohol Use No     History  Drug Use No    Social History   Social History  . Marital Status: Single    Spouse Name: N/A  . Number of Children: N/A  . Years of Education: N/A   Social History Main Topics  . Smoking status: Never Smoker   . Smokeless tobacco: Never Used  . Alcohol Use: No  . Drug Use: No  . Sexual Activity: Not Asked   Other Topics Concern  . None   Social History Narrative   Additional Social History:    Allergies:   Allergies  Allergen Reactions  . Buspar [Buspirone] Rash  . Depakote [Divalproex Sodium] Rash  . Intuniv [Guanfacine Hcl] Rash    Labs:  Results for orders placed or performed during the hospital encounter of 08/03/15 (from the past 48 hour(s))  Comprehensive metabolic panel     Status: None   Collection Time: 08/03/15  6:30 PM  Result Value Ref Range   Sodium 137 135 - 145 mmol/L   Potassium 4.2 3.5 - 5.1 mmol/L   Chloride 105 101 - 111 mmol/L   CO2 24 22 - 32 mmol/L   Glucose, Bld 96 65 - 99 mg/dL   BUN 13 6 - 20 mg/dL   Creatinine, Ser 1.18 0.61 - 1.24 mg/dL   Calcium 9.6 8.9 - 10.3 mg/dL   Total Protein 7.6 6.5 - 8.1 g/dL   Albumin 4.6 3.5 - 5.0 g/dL   AST 29 15 - 41 U/L   ALT 34 17 - 63 U/L   Alkaline Phosphatase 59 38 - 126 U/L   Total Bilirubin 0.6 0.3 - 1.2 mg/dL   GFR calc non Af Amer >60 >60 mL/min   GFR calc  Af Amer >60 >60 mL/min    Comment: (NOTE) The eGFR has been calculated using the CKD EPI equation. This calculation has not been validated in all clinical situations. eGFR's persistently <60 mL/min signify possible Chronic Kidney Disease.    Anion gap 8 5 - 15  Ethanol     Status: None   Collection Time: 08/03/15  6:30 PM  Result Value Ref Range   Alcohol, Ethyl (B) <5 <5 mg/dL    Comment:        LOWEST DETECTABLE LIMIT FOR SERUM ALCOHOL IS 5 mg/dL FOR MEDICAL PURPOSES ONLY   Salicylate level     Status: None   Collection Time: 08/03/15  6:30 PM  Result Value Ref Range   Salicylate Lvl <6.2 2.8 - 30.0 mg/dL  Acetaminophen level     Status: Abnormal  Collection Time: 08/03/15  6:30 PM  Result Value Ref Range   Acetaminophen (Tylenol), Serum <10 (L) 10 - 30 ug/mL    Comment:        THERAPEUTIC CONCENTRATIONS VARY SIGNIFICANTLY. A RANGE OF 10-30 ug/mL MAY BE AN EFFECTIVE CONCENTRATION FOR MANY PATIENTS. HOWEVER, SOME ARE BEST TREATED AT CONCENTRATIONS OUTSIDE THIS RANGE. ACETAMINOPHEN CONCENTRATIONS >150 ug/mL AT 4 HOURS AFTER INGESTION AND >50 ug/mL AT 12 HOURS AFTER INGESTION ARE OFTEN ASSOCIATED WITH TOXIC REACTIONS.   cbc     Status: Abnormal   Collection Time: 08/03/15  6:30 PM  Result Value Ref Range   WBC 12.7 (H) 4.0 - 10.5 K/uL   RBC 4.78 4.22 - 5.81 MIL/uL   Hemoglobin 14.9 13.0 - 17.0 g/dL   HCT 42.3 39.0 - 52.0 %   MCV 88.5 78.0 - 100.0 fL   MCH 31.2 26.0 - 34.0 pg   MCHC 35.2 30.0 - 36.0 g/dL   RDW 12.4 11.5 - 15.5 %   Platelets 274 150 - 400 K/uL  CBG monitoring, ED     Status: None   Collection Time: 08/03/15  6:59 PM  Result Value Ref Range   Glucose-Capillary 92 65 - 99 mg/dL  Rapid urine drug screen (hospital performed)     Status: Abnormal   Collection Time: 08/03/15  7:50 PM  Result Value Ref Range   Opiates NONE DETECTED NONE DETECTED   Cocaine POSITIVE (A) NONE DETECTED   Benzodiazepines POSITIVE (A) NONE DETECTED   Amphetamines NONE  DETECTED NONE DETECTED   Tetrahydrocannabinol NONE DETECTED NONE DETECTED   Barbiturates NONE DETECTED NONE DETECTED    Comment:        DRUG SCREEN FOR MEDICAL PURPOSES ONLY.  IF CONFIRMATION IS NEEDED FOR ANY PURPOSE, NOTIFY LAB WITHIN 5 DAYS.        LOWEST DETECTABLE LIMITS FOR URINE DRUG SCREEN Drug Class       Cutoff (ng/mL) Amphetamine      1000 Barbiturate      200 Benzodiazepine   761 Tricyclics       950 Opiates          300 Cocaine          300 THC              50     Current Facility-Administered Medications  Medication Dose Route Frequency Provider Last Rate Last Dose  . acetaminophen (TYLENOL) tablet 650 mg  650 mg Oral Q4H PRN Lacretia Leigh, MD      . alum & mag hydroxide-simeth (MAALOX/MYLANTA) 200-200-20 MG/5ML suspension 30 mL  30 mL Oral PRN Lacretia Leigh, MD      . FLUoxetine (PROZAC) capsule 20 mg  20 mg Oral Daily Lacretia Leigh, MD   20 mg at 08/04/15 1022  . ibuprofen (ADVIL,MOTRIN) tablet 600 mg  600 mg Oral Q8H PRN Lacretia Leigh, MD      . loratadine (CLARITIN) tablet 10 mg  10 mg Oral Daily Lacretia Leigh, MD   10 mg at 08/04/15 1022  . nicotine (NICODERM CQ - dosed in mg/24 hours) patch 21 mg  21 mg Transdermal Daily Lacretia Leigh, MD   21 mg at 08/04/15 1022  . ondansetron (ZOFRAN) tablet 4 mg  4 mg Oral Q8H PRN Lacretia Leigh, MD       Current Outpatient Prescriptions  Medication Sig Dispense Refill  . amphetamine-dextroamphetamine (ADDERALL) 20 MG tablet Take 20 mg by mouth daily.  0  . cetirizine (ZYRTEC) 10 MG tablet  Take 10 mg by mouth daily.    Marland Kitchen FLUoxetine (PROZAC) 20 MG capsule Take 20 mg by mouth daily.    . rizatriptan (MAXALT) 10 MG tablet Take 10 mg by mouth 2 (two) times daily as needed for migraine. Take for migraine, may repeat dose in 2 hours.      Musculoskeletal: Strength & Muscle Tone: within normal limits Gait & Station: normal Patient leans: N/A  Psychiatric Specialty Exam: Physical Exam  Constitutional: He is oriented to  person, place, and time. He appears well-developed and well-nourished.  HENT:  Head: Normocephalic.  Neck: Normal range of motion.  Respiratory: Effort normal.  Musculoskeletal: Normal range of motion.  Neurological: He is alert and oriented to person, place, and time.  Skin: Skin is warm and dry.  Psychiatric: His speech is normal. He is withdrawn. Cognition and memory are normal. He expresses impulsivity. He exhibits a depressed mood. He expresses suicidal ideation. He expresses suicidal plans.    Review of Systems  Constitutional: Negative.   HENT: Negative.   Eyes: Negative.   Respiratory: Negative.   Cardiovascular: Negative.   Gastrointestinal: Negative.   Genitourinary: Negative.   Musculoskeletal: Negative.   Skin: Negative.   Neurological: Negative.   Endo/Heme/Allergies: Negative.   Psychiatric/Behavioral: Positive for depression, suicidal ideas and substance abuse. The patient is nervous/anxious.     Blood pressure 121/72, pulse 90, temperature 98.1 F (36.7 C), temperature source Oral, resp. rate 16, weight 141.976 kg (313 lb), SpO2 100 %.Body mass index is 46.2 kg/(m^2).  General Appearance: Disheveled  Eye Contact:  Fair  Speech:  Normal Rate  Volume:  Decreased  Mood:  Anxious and Depressed  Affect:  Congruent  Thought Process:  Coherent  Orientation:  Full (Time, Place, and Person)  Thought Content:  Rumination  Suicidal Thoughts:  Yes.  with intent/plan  Homicidal Thoughts:  No  Memory:  Immediate;   Fair Recent;   Fair Remote;   Fair  Judgement:  Impaired  Insight:  Fair  Psychomotor Activity:  Decreased  Concentration:  Concentration: Fair and Attention Span: Fair  Recall:  [employer] of Knowledge:  Fair  Language:  Fair  Akathisia:  No  Handed:  Right  AIMS (if indicated):     Assets:  Housing Leisure Time Physical Health Resilience Social Support  ADL's:  Intact  Cognition:  Impaired,  Mild  Sleep:        Treatment Plan Summary: Daily  contact with patient to assess and evaluate symptoms and progress in treatment, Medication management and Plan major depressive disorder, recurrent, severe without psychosis:  -Crisis stabilization -Medication management:  Continue his Prozac 20 mg daily for depression but not his Adderall 20 mg due to substance abuse and not needed in the ED -Individual and substance abuse counseling  Disposition: Recommend psychiatric Inpatient admission when medically cleared.  Waylan Boga, NP 08/04/2015 11:23 AM Patient seen face-to-face for psychiatric evaluation, chart reviewed and case discussed with the physician extender and developed treatment plan. Reviewed the information documented and agree with the treatment plan. Corena Pilgrim, MD

## 2015-08-04 NOTE — ED Notes (Signed)
[facility]  He left the unit ambulatory with [employer].  He has no belongings here in the ED.

## 2015-08-04 NOTE — Progress Notes (Signed)
ADMISSION NOTE Patient is a 20 year old male [facility]  Patient reports taking 6 bars of Xanax after breaking up with his girlfriend.  Patient currently denies suicidal ideation.  Patient states, "I only took the medications so I could forget my pain."  Patient is alert and oriented x 4.  Patient is calm and cooperative during assessment.  Plan of care and consent for treatment reviewed and signed. Patient searched for contraband.  Skin assessment completed.  Skin is dry and intact.    Patient oriented to the unit, staff and room.  Routine safety checks initiated.  Patient offered support and encouragement as needed.  Patient is safe on the unit.

## 2015-08-04 NOTE — BH Assessment (Signed)
[facility] Assessment Progress Note  Per Thedore MinsMojeed Akintayo, MD, this pt requires psychiatric hospitalization at this time.  Berneice Heinrichina Tate, RN, [id number].  Pt has signed Voluntary Admission and Consent for Treatment, as well as Consent to Release Information to family members, and signed [facility].  Pt's nurse, Rudean HittDawnaly, has been notified, and agrees to send original paperwork along with pt via [employer] [place], and to call report to [phone].  Doylene Canninghomas Rhyann Berton, MA Triage Specialist [phone]

## 2015-08-05 DIAGNOSIS — F332 Major depressive disorder, recurrent severe without psychotic features: Principal | ICD-10-CM

## 2015-08-05 MED ORDER — HYDROXYZINE HCL 25 MG PO TABS
25.0000 mg | ORAL_TABLET | Freq: Three times a day (TID) | ORAL | Status: DC | PRN
  Administered 2015-08-05 – 2015-08-06 (×5): 25 mg via ORAL
  Filled 2015-08-05 (×5): qty 1

## 2015-08-05 NOTE — Progress Notes (Signed)
Addendum Joselyn Glassman: Charles Morton completed his daily assessment . On it he wrote he denied  SI today and he rated his depression, hopelessness and and anxiety  " 0/0/7", respectively.

## 2015-08-05 NOTE — [facility] Suicide Risk Assessment (Signed)
[facility]   Nursing information obtained from:  Patient Demographic factors:  Male Current Mental Status:  NA Loss Factors:  Loss of significant relationship Historical Factors:  NA Risk Reduction Factors:  Positive therapeutic relationship, Positive social support  Total Time spent with patient: 45 minutes Principal Problem: Depression, major, severe recurrence (HCC) Diagnosis:   Patient Active Problem List   Diagnosis Date Noted  . Severe recurrent major depression without psychotic features (HCC) [F33.2] 08/04/2015  . Cocaine abuse [F14.10] 08/04/2015  . Depression, major, severe recurrence (HCC) [F33.2] 08/04/2015  . Mild obstructive sleep apnea [G47.33] 01/24/2015  . Obesity, Class III, BMI 40-49.9 (morbid obesity) (HCC) [E66.01] 01/24/2015  . Prediabetes [R73.03] 01/24/2015  . Fatty liver disease, nonalcoholic [K76.0] 16/10/960411/22/2016  . Elevated blood pressure [R03.0] 01/24/2015  . Pilonidal cyst with abscess [L05.01] 07/20/2013   Subjective Data: Patient is 20 year old Caucasian male who was admitted after taking overdose on Xanax.  Patient told it was a suicidal attempt.  He had broke up with his girlfriend and experiencing severe depression, anxiety and hopelessness.  His UDS is positive for cocaine but patient denies using drugs.  He denies any hallucination or any paranoia.  Continued Clinical Symptoms:  Alcohol Use Disorder Identification Test Final Score (AUDIT): 3 The "Alcohol Use Disorders Identification Test", Guidelines for Use in Primary Care, Second Edition.  World Science writerHealth Organization [facility](WHO). Score between 0-7:  no or low risk or alcohol related problems. Score between 8-15:  moderate risk of alcohol related problems. Score between 16-19:  high risk of alcohol related problems. Score 20 or above:  warrants further diagnostic evaluation for alcohol dependence and treatment.   CLINICAL FACTORS:   Depression:    Anhedonia Hopelessness Impulsivity Insomnia Recent sense of peace/wellbeing Severe Unstable or Poor Therapeutic Relationship   Musculoskeletal: Strength & Muscle Tone: within normal limits Gait & Station: normal Patient leans: N/A  Psychiatric Specialty Exam: Physical Exam  ROS  Blood pressure 117/73, pulse 113, temperature 97.4 F (36.3 C), temperature source Oral, resp. rate 18, height 5\' 11"  (1.803 m), weight 120.657 kg (266 lb), SpO2 100 %.Body mass index is 37.12 kg/(m^2).  General Appearance: Casual and Guarded  Eye Contact:  Fair  Speech:  Normal Rate  Volume:  Decreased  Mood:  Anxious, Depressed and Dysphoric  Affect:  Constricted and Depressed  Thought Process:  Goal Directed  Orientation:  Full (Time, Place, and Person)  Thought Content:  Rumination  Suicidal Thoughts:  Yes.  with intent/plan  Homicidal Thoughts:  No  Memory:  Immediate;   Fair Recent;   Fair Remote;   Fair  Judgement:  Fair  Insight:  Lacking  Psychomotor Activity:  Decreased  Concentration:  Concentration: Fair and Attention Span: Fair  Recall:  [employer]  Fund of Knowledge:  Good  Language:  Good  Akathisia:  No  Handed:  Right  AIMS (if indicated):     Assets:  Communication Skills Housing  ADL's:  Intact  Cognition:  WNL  Sleep:  Number of Hours: 6.75      COGNITIVE FEATURES THAT CONTRIBUTE TO RISK:  Closed-mindedness, Loss of executive function and Polarized thinking    SUICIDE RISK:   Moderate:  Frequent suicidal ideation with limited intensity, and duration, some specificity in terms of plans, no associated intent, good self-control, limited dysphoria/symptomatology, some risk factors present, and identifiable protective factors, including available and accessible social support.  PLAN OF CARE: Patient is 20 year old Caucasian male who was admitted after taking overdose on  Xanax.  He is also positive for cocaine.  Patient denies taking any drugs.  He need psychiatric inpatient  treatment and stabilization.  We will start antidepressant and encouraged to participate in group milieu therapy.  Please see history physical endocrine plan for more details.    I certify that inpatient services furnished can reasonably be expected to improve the patient's condition.   ARFEEN,SYED T., MD 08/05/2015, 2:20 PM

## 2015-08-05 NOTE — H&P (Signed)
Psychiatric Admission Assessment Adult  Patient Identification: Charles Morton MRN:  [id number] Date of Evaluation:  08/05/2015 Chief Complaint:  Severe MDD without Psychotic features Principal Diagnosis: <principal problem not specified> Diagnosis:   Patient Active Problem List   Diagnosis Date Noted  . Severe recurrent major depression without psychotic features ([place]) [F33.2] 08/04/2015  . Cocaine abuse [F14.10] 08/04/2015  . Depression, major, severe recurrence ([place]) [F33.2] 08/04/2015  . Mild obstructive sleep apnea [G47.33] 01/24/2015  . Obesity, Class III, BMI 40-49.9 (morbid obesity) ([place]) [E66.01] 01/24/2015  . Prediabetes [R73.03] 01/24/2015  . Fatty liver disease, nonalcoholic [V40.0] 86/76/1950  . Elevated blood pressure [R03.0] 01/24/2015  . Pilonidal cyst with abscess [L05.01] 07/20/2013   History of Present Illness:Per HPI-19 y.o. male presenting to [facility] after an overdose on Xanax. Pt stated "I took 6  Xanax bars". "I have never done this before". Pt reported that he broke up with his girlfriend after dating for 1  years. Pt denies that the overdose was a suicide attempt; however pt stated repeated "I wanted to take enough so that I could forget about all the physical pain I am in". "If I wanted to commit suicide there was shot gun, glock and AR next to the pill bottle". Pt did not report any previous suicide attempts or self-injurious behaviors. Pt denies HI and AVH at this time. Pt reported that he is currently receiving medication management and reported that he would like his Adderall dosage increase so that it would last him throughout the day. Pt did not report a trauma history.    On Evaluation:  Charles Morton is awake, alert and oriented X4. Seen standing in hallway talking on the phone.  Denies suicidal or homicidal ideation. Denies auditory or visual hallucination and does not appear to be responding to internal stimuli. Patient validates information provided above.  Patient reports is very remorseful reading his actions.   Patient reports he is medication compliant without mediation side effects. Report learning new coping skills to stay in the gym and workout. Patient reports a recent weight loss surgery.  States his depression 2/10. Patient states "I am feeling back to my self today. States I will not do this again."   Reports good appetite and resting well. Patient report he is excited/ready for discharge. Support, encouragement and reassurance was provided.   Associated Signs/Symptoms: Depression Symptoms:  depressed mood, suicidal thoughts with specific plan, loss of energy/fatigue, (Hypo) Manic Symptoms:  Impulsivity, Irritable Mood, Anxiety Symptoms:  Excessive Worry, Psychotic Symptoms:  Hallucinations: None PTSD Symptoms: Avoidance:  None Total Time spent with patient: 45 minutes  Past Psychiatric History: See Above  Is the patient at risk to self? Yes.    Has the patient been a risk to self in the past 6 months? Yes.    Has the patient been a risk to self within the distant past? Yes.    Is the patient a risk to others? No.  Has the patient been a risk to others in the past 6 months? No.  Has the patient been a risk to others within the distant past? No.   Prior Inpatient Therapy:   Prior Outpatient Therapy:    Alcohol Screening: 1. How often do you have a drink containing alcohol?: Monthly or less 2. How many drinks containing alcohol do you have on a typical day when you are drinking?: 1 or 2 3. How often do you have six or more drinks on one occasion?: Monthly Preliminary Score: 2 4.  How often during the last year have you found that you were not able to stop drinking once you had started?: Never 5. How often during the last year have you failed to do what was normally expected from you becasue of drinking?: Never 6. How often during the last year have you needed a first drink in the morning to get yourself going after a heavy  drinking session?: Never 7. How often during the last year have you had a feeling of guilt of remorse after drinking?: Never 8. How often during the last year have you been unable to remember what happened the night before because you had been drinking?: Never 9. Have you or someone else been injured as a result of your drinking?: No 10. Has a relative or friend or a doctor or another health worker been concerned about your drinking or suggested you cut down?: No Alcohol Use Disorder Identification Test Final Score (AUDIT): 3 Brief Intervention: Patient declined brief intervention Substance Abuse History in the last 12 months:  Yes.   Consequences of Substance Abuse: NA Previous Psychotropic Medications: NO Psychological Evaluations:NO Past Medical History:  Past Medical History  Diagnosis Date  . Asthma   . Allergy   . ADD (attention deficit disorder)   . Pilonidal cyst   . Acne   . GERD (gastroesophageal reflux disease)   . Hypertension   . Sleep apnea     mild sleep apnea and no cpap   . Diabetes mellitus without complication (HCC)     prediabetic on no meds   . Anxiety     Past Surgical History  Procedure Laterality Date  . None    . Pilonidal cyst excision N/A 09/09/2013    Procedure: CYST EXCISION PILONIDAL EXTENSIVE;  Surgeon: Harl Bowie, MD;  Location: [facility];  Service: General;  Laterality: N/A;  . Laparoscopic gastric sleeve resection N/A 01/24/2015    Procedure: LAPAROSCOPIC GASTRIC SLEEVE RESECTION;  Surgeon: Greer Pickerel, MD;  Location: [facility];  Service: General;  Laterality: N/A;  . Upper gi endoscopy  01/24/2015    Procedure: UPPER GI ENDOSCOPY;  Surgeon: Greer Pickerel, MD;  Location: [facility];  Service: General;;   Family History: History reviewed. No pertinent family history. Family Psychiatric  History: See Above Tobacco Screening: _0 ([phone])::1)@ Social History:  History  Alcohol Use No     History  Drug Use  . Yes  . Special: Cocaine     Additional Social History:                           Allergies:   Allergies  Allergen Reactions  . Buspar [Buspirone] Rash  . Depakote [Divalproex Sodium] Rash  . Intuniv [Guanfacine Hcl] Rash   Lab Results:  Results for orders placed or performed during the hospital encounter of 08/03/15 (from the past 48 hour(s))  Comprehensive metabolic panel     Status: None   Collection Time: 08/03/15  6:30 PM  Result Value Ref Range   Sodium 137 135 - 145 mmol/L   Potassium 4.2 3.5 - 5.1 mmol/L   Chloride 105 101 - 111 mmol/L   CO2 24 22 - 32 mmol/L   Glucose, Bld 96 65 - 99 mg/dL   BUN 13 6 - 20 mg/dL   Creatinine, Ser 1.18 0.61 - 1.24 mg/dL   Calcium 9.6 8.9 - 10.3 mg/dL   Total Protein 7.6 6.5 - 8.1 g/dL   Albumin 4.6 3.5 - 5.0  g/dL   AST 29 15 - 41 U/L   ALT 34 17 - 63 U/L   Alkaline Phosphatase 59 38 - 126 U/L   Total Bilirubin 0.6 0.3 - 1.2 mg/dL   GFR calc non Af Amer >60 >60 mL/min   GFR calc Af Amer >60 >60 mL/min    Comment: (NOTE) The eGFR has been calculated using the CKD EPI equation. This calculation has not been validated in all clinical situations. eGFR's persistently <60 mL/min signify possible Chronic Kidney Disease.    Anion gap 8 5 - 15  Ethanol     Status: None   Collection Time: 08/03/15  6:30 PM  Result Value Ref Range   Alcohol, Ethyl (B) <5 <5 mg/dL    Comment:        LOWEST DETECTABLE LIMIT FOR SERUM ALCOHOL IS 5 mg/dL FOR MEDICAL PURPOSES ONLY   Salicylate level     Status: None   Collection Time: 08/03/15  6:30 PM  Result Value Ref Range   Salicylate Lvl <4.0 2.8 - 30.0 mg/dL  Acetaminophen level     Status: Abnormal   Collection Time: 08/03/15  6:30 PM  Result Value Ref Range   Acetaminophen (Tylenol), Serum <10 (L) 10 - 30 ug/mL    Comment:        THERAPEUTIC CONCENTRATIONS VARY SIGNIFICANTLY. A RANGE OF 10-30 ug/mL MAY BE AN EFFECTIVE CONCENTRATION FOR MANY PATIENTS. HOWEVER, SOME ARE BEST TREATED AT CONCENTRATIONS OUTSIDE  THIS RANGE. ACETAMINOPHEN CONCENTRATIONS >150 ug/mL AT 4 HOURS AFTER INGESTION AND >50 ug/mL AT 12 HOURS AFTER INGESTION ARE OFTEN ASSOCIATED WITH TOXIC REACTIONS.   cbc     Status: Abnormal   Collection Time: 08/03/15  6:30 PM  Result Value Ref Range   WBC 12.7 (H) 4.0 - 10.5 K/uL   RBC 4.78 4.22 - 5.81 MIL/uL   Hemoglobin 14.9 13.0 - 17.0 g/dL   HCT 78.1 28.3 - 39.5 %   MCV 88.5 78.0 - 100.0 fL   MCH 31.2 26.0 - 34.0 pg   MCHC 35.2 30.0 - 36.0 g/dL   RDW 31.7 52.4 - 74.5 %   Platelets 274 150 - 400 K/uL  CBG monitoring, ED     Status: None   Collection Time: 08/03/15  6:59 PM  Result Value Ref Range   Glucose-Capillary 92 65 - 99 mg/dL  Rapid urine drug screen (hospital performed)     Status: Abnormal   Collection Time: 08/03/15  7:50 PM  Result Value Ref Range   Opiates NONE DETECTED NONE DETECTED   Cocaine POSITIVE (A) NONE DETECTED   Benzodiazepines POSITIVE (A) NONE DETECTED   Amphetamines NONE DETECTED NONE DETECTED   Tetrahydrocannabinol NONE DETECTED NONE DETECTED   Barbiturates NONE DETECTED NONE DETECTED    Comment:        DRUG SCREEN FOR MEDICAL PURPOSES ONLY.  IF CONFIRMATION IS NEEDED FOR ANY PURPOSE, NOTIFY LAB WITHIN 5 DAYS.        LOWEST DETECTABLE LIMITS FOR URINE DRUG SCREEN Drug Class       Cutoff (ng/mL) Amphetamine      1000 Barbiturate      200 Benzodiazepine   200 Tricyclics       300 Opiates          300 Cocaine          300 THC              50     Blood Alcohol level:  Lab Results  Component  Value Date   ETH <5 63/84/6659    Metabolic Disorder Labs:  Lab Results  Component Value Date   HGBA1C  11/19/2007    5.7 (NOTE)   The ADA recommends the following therapeutic goal for glycemic   control related to Hgb A1C measurement:   Goal of Therapy:   < 7.0% Hgb A1C   Reference: American Diabetes Association: Clinical Practice   Recommendations 2008, Diabetes Care,  2008, 31:(Suppl 1).   MPG 117 11/19/2007   Lab Results  Component  Value Date   PROLACTIN  11/19/2007    7.2 (NOTE)     Reference Ranges:                 Male:                       2.1 -  17.1 ng/ml                 Male:   Pregnant          9.7 - 208.5 ng/mL                           Non Pregnant      2.8 -  29.2 ng/mL                           Post  Menopausal   1.8 -  20.3 ng/mL                     Lab Results  Component Value Date   CHOL  11/19/2007    137        ATP III CLASSIFICATION:  <200     mg/dL   Desirable  200-239  mg/dL   Borderline High  >=240    mg/dL   High   TRIG 113 11/19/2007   HDL 39 11/19/2007   CHOLHDL 3.5 11/19/2007   VLDL 23 11/19/2007   LDLCALC  11/19/2007    75        Total Cholesterol/HDL:CHD Risk Coronary Heart Disease Risk Table                     Men   Women  1/2 Average Risk   3.4   3.3    Current Medications: Current Facility-Administered Medications  Medication Dose Route Frequency Provider Last Rate Last Dose  . acetaminophen (TYLENOL) tablet 650 mg  650 mg Oral Q6H PRN Patrecia Pour, NP      . alum & mag hydroxide-simeth (MAALOX/MYLANTA) 200-200-20 MG/5ML suspension 30 mL  30 mL Oral Q4H PRN Patrecia Pour, NP      . FLUoxetine (PROZAC) capsule 20 mg  20 mg Oral Daily Patrecia Pour, NP      . loratadine (CLARITIN) tablet 10 mg  10 mg Oral Daily Patrecia Pour, NP      . magnesium hydroxide (MILK OF MAGNESIA) suspension 30 mL  30 mL Oral Daily PRN Patrecia Pour, NP       PTA Medications: Prescriptions prior to admission  Medication Sig Dispense Refill Last Dose  . cetirizine (ZYRTEC) 10 MG tablet Take 10 mg by mouth daily.   08/02/2015  . FLUoxetine (PROZAC) 20 MG capsule Take 20 mg by mouth daily.   08/03/2015 at Unknown time  . rizatriptan (MAXALT) 10 MG tablet Take 10 mg by mouth  2 (two) times daily as needed for migraine. Take for migraine, may repeat dose in 2 hours.   08/03/2015 at Unknown time    Musculoskeletal: Strength & Muscle Tone: within normal limits Gait & Station: normal Patient leans:  N/A  Psychiatric Specialty Exam: Physical Exam  Nursing note and vitals reviewed. Constitutional: He appears well-developed.  Skin: Skin is warm and dry.    ROS  Blood pressure 117/73, pulse 113, temperature 97.4 F (36.3 C), temperature source Oral, resp. rate 18, height _0  (1.803 m), weight 120.657 kg (266 lb), SpO2 100 %.Body mass index is 37.12 kg/(m^2).  General Appearance: Casual  Eye Contact:  Good  Speech:  Clear and Coherent  Volume:  Normal  Mood:  Depressed  Affect:  Congruent  Thought Process:  Coherent  Orientation:  Full (Time, Place, and Person)  Thought Content:  Hallucinations: None  Suicidal Thoughts:  No Denies at this time  Homicidal Thoughts:  No  Memory:  Immediate;   Fair Recent;   Fair Remote;   Fair  Judgement:  Fair  Insight:  Fair  Psychomotor Activity:  Normal  Concentration:  Concentration: Fair  Recall:  [employer] of Knowledge:  Fair  Language:  Fair  Akathisia:  No  Handed:  Right  AIMS (if indicated):     Assets:  Desire for Improvement Resilience Social Support  ADL's:  Intact  Cognition:  WNL  Sleep:  Number of Hours: 6.75     I agree with current treatment plan on 08/05/2015, Patient seen face-to-face for psychiatric evaluation follow-up, chart reviewed and case discussed with the MD Bridget Westbrooks. Reviewed the information documented and agree with the treatment plan.  Treatment Plan Summary: Daily contact with patient to assess and evaluate symptoms and progress in treatment and Medication management  Medication management and Plan major depressive disorder, recurrent, severe without psychosis:  -Crisis stabilization -Medication management:  Continue Prozac 20 mg daily for depression  Adderall 20 mg (hold medication) -Individual and substance abuse counseling Reviewed labs: BAL -neg, UDS +pos for cocaine and benzodizpines. CSW will start working on disposition.  Patient to participate in therapeutic milieu   Observation  Level/Precautions:  15 minute checks  Laboratory:  CBC Chemistry Profile HbAIC UDS UA  Psychotherapy:  Individual and group session  Medications:  Prozac 43m  Consultations:  Psychiatry  Discharge Concerns:    Estimated LOS:5-7 days  Other:     I certify that inpatient services furnished can reasonably be expected to improve the patient's condition.    TDerrill Center NP 6/3/201710:56 AM  Patient seen face to face for psychiatric evaluation. Chart reviewed and finding discussed with Physician extender. Agreed with disposition and treatment plan.   SBerniece Andreas MD
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
## 2015-08-05 NOTE — Progress Notes (Signed)
Writer observed patient lying in his bed asleep.                                                                                                          Writer entered patients room and observed him lying in bed asleep. No distress noted. Writer called his name several times and he continued to sleep. Writer left room and will assess patient later. Safety maintained on unit with 15 min checks

## 2015-08-05 NOTE — Progress Notes (Signed)
D: Patient pleasant and assertive during assessment. Pt brightens on approach and is talkative and interacting well with peers in the milieu. Pt did voice request for Ensure drinks between meals, as he had a recent gastric sleeve surgery done. Pt states he feels much better about himself after having surgery and losing weight.  A: Q 15 minute safety checks, encourage staff/peer interaction and group participation; administer medications as ordered. R: Pt with no inappropriate behaviors noted this shift.

## 2015-08-05 NOTE — [facility] Counselor (Signed)
Adult Comprehensive Assessment  Patient ID: Charles Morton, male   DO: November 01, 1995, [age] [address]   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 Family Relationships: Breakup with first real girlfriend has been stressful Surveyor, quantityinancial / Lack of resources (include bankruptcy): NA Housing / Lack of housing: NA Physical health (include injuries & life threatening diseases): Recent gastric surgery has eliminated most health concerns including HTN, Diabetes, and GERD Social relationships: NA Substance abuse: NA Bereavement / Loss: Relationship with Girlfriend  Living/Environment/Situation:  Living Arrangements: Parent Living conditions (as described by patient or guardian): Stable home with mother How long has patient lived in current situation?: 19 years What is atmosphere in current home: Comfortable, ParamedicLoving, Supportive  Family History:  Marital status: Single Are you sexually active?: Yes What is your sexual orientation?: Heterosexual Has your sexual activity been affected by drugs, alcohol, medication, or emotional stress?: No Does patient have children?: No  Childhood History:  By whom was/is the patient raised?: Both parents Additional childhood history information: Parents separated and divorced when pt was young Description of patient's relationship with caregiver when they were a child: Good with both Patient's description of current relationship with people who raised him/her: Remains good with both How were you disciplined when you got in trouble as a child/adolescent?: Loss of privileges, paying the consequences Does patient have siblings?: No Did patient suffer any verbal/emotional/physical/sexual abuse as a child?: No Did patient suffer from severe childhood neglect?: No Has patient ever been sexually abused/assaulted/raped as an adolescent  or adult?: No Was the patient ever a victim of a crime or a disaster?: No Witnessed domestic violence?: No Has patient been effected by domestic violence as an adult?: No  Education:  Highest grade of school patient has completed: 13 Currently a student?: Yes If yes, how has current illness impacted academic performance: NA Name of school: [employer] Contact person: Self How long has the patient attended?: 13 Learning disability?: Yes What learning problems does patient have?: ADD  Employment/Work Situation:   Employment situation: Consulting civil engineertudent Patient's job has been impacted by current illness: No What is the longest time patient has a held a job?: 1 summer Where was the patient employed at that time?: [employer] Has patient ever been in the Eli Lilly and Companymilitary?: No Are There Guns or Other Weapons in Your Home?: Yes Types of Guns/Weapons: Patient has multiple firearms in addition to mother's work firearm in the home Are These ComptrollerWeapons Safely Secured?: No Who Could Verify You Are Able To Have These Secured:: Mother will need to be contacted  Financial Resources:   Financial resources: Support from parents / caregiver Does patient have a Lawyerrepresentative payee or guardian?: No  Alcohol/Substance Abuse:   What has been your use of drugs/alcohol within the last 12 months?: Rare occasional use of alcohol; no prior use of substances like THC or Xanax ~ Patient says all divorcee's need xanax to get over the stress If attempted suicide, did drugs/alcohol play a role in this?: No Alcohol/Substance Abuse Treatment Hx: Denies past history  Social Support System:   Forensic psychologistatient's Community Support System: Good Describe Community Support System: Pt reports strong support group of other students, friends, church family, extended family Type of faith/religion: Ephriam KnucklesChristian How does patient's faith help to cope with current illness?: Helps give me hope  Leisure/Recreation:   Leisure and Hobbies: Golf, Technical brewerbeach and  football  Strengths/Needs:  What things does the patient do well?: Good student, friend, boyfriend, son, grandson, good male person in general In what areas does patient struggle / problems for patient: Painful loss of first true girlfriend with whom he had been intimate and for seen a future  Discharge Plan:   Does patient have access to transportation?: Yes Will patient be returning to same living situation after discharge?: Yes Currently receiving community mental health services: Yes (From Whom) (But needs a therapist) Does patient have financial barriers related to discharge medications?: No  Summary/Recommendations:   Summary and Recommendations (to be completed by the evaluator): Patient is 20 YO male college student admitted to [facility] with depression  who reports primary trigger for admission was unintentional overdose on Xanax following forced (forced by significant other's parents) breakup with girlfriend of 9 months.  Patient will benefit from crisis stabilization, medication evaluation, group therapy and psycho education, in addition to case management for discharge planning. At discharge it is recommended that patient adhere to the established discharge plan and continue in treatment.   Clide Dales. 08/05/2015

## 2015-08-05 NOTE — Progress Notes (Signed)
[name] is asleep in the bed ( currently ). He does not open his eyes when this nurse approaches his bedside. He does not respond when this nurse asks him to complete his daily assessment , rather he rolls over and pulls the sheet over his head.A Safety in place. R Will cont to encourage engagement.

## 2015-08-05 NOTE — Progress Notes (Signed)
Pt attended AA group this evening.  

## 2015-08-06 ENCOUNTER — Encounter (HOSPITAL_COMMUNITY): Payer: Self-pay | Admitting: Registered Nurse

## 2015-08-06 DIAGNOSIS — F329 Major depressive disorder, single episode, unspecified: Secondary | ICD-10-CM

## 2015-08-06 DIAGNOSIS — F32A Depression, unspecified: Secondary | ICD-10-CM | POA: Insufficient documentation

## 2015-08-06 DIAGNOSIS — F1910 Other psychoactive substance abuse, uncomplicated: Secondary | ICD-10-CM | POA: Insufficient documentation

## 2015-08-06 DIAGNOSIS — F1994 Other psychoactive substance use, unspecified with psychoactive substance-induced mood disorder: Secondary | ICD-10-CM | POA: Insufficient documentation

## 2015-08-06 MED ORDER — ENSURE ENLIVE PO LIQD
237.0000 mL | Freq: Two times a day (BID) | ORAL | Status: DC
  Administered 2015-08-06 – 2015-08-08 (×4): 237 mL via ORAL

## 2015-08-06 MED ORDER — NICOTINE 14 MG/24HR TD PT24
14.0000 mg | MEDICATED_PATCH | Freq: Every day | TRANSDERMAL | Status: DC
  Filled 2015-08-06 (×2): qty 1

## 2015-08-06 NOTE — [facility] Group Notes (Signed)
[facility] Group Notes: (Clinical Social Work)   08/06/2015      Type of Therapy:  Group Therapy   Participation Level:  Did Not Attend despite MHT prompting   Chevette Fee Grossman-Orr, LCSW 08/06/2015, 12:05 PM     

## 2015-08-06 NOTE — [facility] Group Notes (Signed)
[facility] Group Notes: (Nursing/MHT/Case Management/Adjunct)  Date: 08/06/2015  Time: 10:05 AM  Type of Therapy: Psychoeducational Skills  Participation Level: Did Not Attend  Participation Quality: N/A  Affect: N/A  Cognitive: N/A  Insight: None  Engagement in Group: None  Modes of Intervention: Activity, Discussion, Education and Support  Summary of Progress/Problems: Patient was invited to group but did not attend.  

## 2015-08-06 NOTE — Progress Notes (Signed)
Data Reports sleeping "good" without PRN sleep med.  Rates depression 0/10, hopelessness 0/10, and anxiety 4/10. Affect blunted but appropriate, mood "anxious."  Denies HI, SI, AVH.  Patient asleep during med pass, checked in with patient at 0845- declined medicines (Prozac, loratidine, Nicotine patch "I didn't take them yesterday, I don't need them.  I took Prozac once a long time ago."  Says he has spoken to a provider about the refusal.  When asked what he would work on today he said "finding out what my mom wants me to do" "before I didn't have a job and I was just hanging out" "I want to know what she wants me to do around the house when I get out of here."    Action Patient educated on indication for Prozac, notified Shuvon, NP of refusal x2 days.  Remained on 15 minute checks. Vistaril given 1023 for anxiety.  Positive reinforcement given for his goal.  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  She said as long as I am working and helping out around the house she will let me stay and will feed me."  Reports improvement in anxiety after PRN Vistaril.  Shuvon, NP says she will speak to patient about refusal of Prozac.

## 2015-08-06 NOTE — Progress Notes (Signed)
[facility] MD Progress Note  08/06/2015 2:43 PM Charles Morton  MRN:  [id number]   Subjective:  "I'm feeling good.  I just needed a rest to get the drugs out of my system and the only way to get into the hospital to let that happen I had to tell them that I was going to kill myself." Patient seen by this provider and chart reviewed 08/06/2015.  On evaluation:  Charles Morton reports that he is feeling better since he has had a chance to rest.  Reports on a drug binge and was hallucinating which he is denying at this time.  States that he has no psych history and has never been on medication and that he doesn't needed it now.  States that he has been eating/sleeping without difficulty and attending the AA meeting which he will continue once he has been discharged.  At this time patient denies suicidal/homicidal ideation, psychosis, and paranoia.      Principal Problem: Depression, major, severe recurrence (HCC) Diagnosis:   Patient Active Problem List   Diagnosis Date Noted  . Severe recurrent major depression without psychotic features (HCC) [F33.2] 08/04/2015  . Cocaine abuse [F14.10] 08/04/2015  . Depression, major, severe recurrence (HCC) [F33.2] 08/04/2015  . Mild obstructive sleep apnea [G47.33] 01/24/2015  . Obesity, Class III, BMI 40-49.9 (morbid obesity) (HCC) [E66.01] 01/24/2015  . Prediabetes [R73.03] 01/24/2015  . Fatty liver disease, nonalcoholic [K76.0] 78/29/5621  . Elevated blood pressure [R03.0] 01/24/2015  . Pilonidal cyst with abscess [L05.01] 07/20/2013   Total Time spent with patient: 25 minutes  Past Psychiatric History: Denies past psychiatric history  Past Medical History:  Past Medical History  Diagnosis Date  . Asthma   . Allergy   . ADD (attention deficit disorder)   . Pilonidal cyst   . Acne   . GERD (gastroesophageal reflux disease)   . Hypertension   . Sleep apnea     mild sleep apnea and no cpap   . Diabetes mellitus without complication (HCC)    prediabetic on no meds   . Anxiety     Past Surgical History  Procedure Laterality Date  . None    . Pilonidal cyst excision N/A 09/09/2013    Procedure: CYST EXCISION PILONIDAL EXTENSIVE;  Surgeon: Shelly Rubenstein, MD;  Location: [facility];  Service: General;  Laterality: N/A;  . Laparoscopic gastric sleeve resection N/A 01/24/2015    Procedure: LAPAROSCOPIC GASTRIC SLEEVE RESECTION;  Surgeon: Gaynelle Adu, MD;  Location: [facility];  Service: General;  Laterality: N/A;  . Upper gi endoscopy  01/24/2015    Procedure: UPPER GI ENDOSCOPY;  Surgeon: Gaynelle Adu, MD;  Location: [facility];  Service: General;;   Family History: History reviewed. No pertinent family history. Family Psychiatric  History: Denies family history of metal illness Social History:  History  Alcohol Use No     History  Drug Use  . Yes  . Special: Cocaine    Social History   Social History  . Marital Status: Single    Spouse Name: N/A  . Number of Children: N/A  . Years of Education: N/A   Social History Main Topics  . Smoking status: Current Every Day Smoker -- 0.25 packs/day    Types: Cigarettes  . Smokeless tobacco: Never Used  . Alcohol Use: No  . Drug Use: Yes    Special: Cocaine  . Sexual Activity: Yes   Other Topics Concern  . None   Social History Narrative   Additional  Social History:                         Sleep: Good  Appetite:  Good  Current Medications: Current Facility-Administered Medications  Medication Dose Route Frequency Provider Last Rate Last Dose  . acetaminophen (TYLENOL) tablet 650 mg  650 mg Oral Q6H PRN Charm Rings, NP      . alum & mag hydroxide-simeth (MAALOX/MYLANTA) 200-200-20 MG/5ML suspension 30 mL  30 mL Oral Q4H PRN Charm Rings, NP      . feeding supplement (ENSURE ENLIVE) (ENSURE ENLIVE) liquid 237 mL  237 mL Oral BID BM Shuvon B Rankin, NP   237 mL at 08/06/15 1118  . FLUoxetine (PROZAC) capsule 20 mg  20 mg Oral Daily Charm Rings, NP   20 mg  at 08/05/15 0800  . hydrOXYzine (ATARAX/VISTARIL) tablet 25 mg  25 mg Oral TID PRN Cleotis Nipper, MD   25 mg at 08/06/15 1033  . loratadine (CLARITIN) tablet 10 mg  10 mg Oral Daily Charm Rings, NP   10 mg at 08/05/15 0800  . magnesium hydroxide (MILK OF MAGNESIA) suspension 30 mL  30 mL Oral Daily PRN Charm Rings, NP        Lab Results: No results found for this or any previous visit (from the past 48 hour(s)).  Blood Alcohol level:  Lab Results  Component Value Date   ETH <5 08/03/2015    Physical Findings: AIMS: Facial and Oral Movements Muscles of Facial Expression: None, normal Lips and Perioral Area: None, normal Jaw: None, normal Tongue: None, normal,Extremity Movements Upper (arms, wrists, hands, fingers): None, normal Lower (legs, knees, ankles, toes): None, normal, Trunk Movements Neck, shoulders, hips: None, normal, Overall Severity Severity of abnormal movements (highest score from questions above): None, normal Incapacitation due to abnormal movements: None, normal Patient's awareness of abnormal movements (rate only patient's report): No Awareness, Dental Status Current problems with teeth and/or dentures?: No Does patient usually wear dentures?: No  CIWA:    COWS:     Musculoskeletal: Strength & Muscle Tone: within normal limits Gait & Station: normal Patient leans: N/A  Psychiatric Specialty Exam: Physical Exam  Nursing note and vitals reviewed. Constitutional: He is oriented to person, place, and time.  Neck: Normal range of motion.  Respiratory: Effort normal.  Musculoskeletal: Normal range of motion.  Neurological: He is alert and oriented to person, place, and time.  Skin: Skin is warm and dry.    Review of Systems  Psychiatric/Behavioral: Positive for substance abuse. Negative for suicidal ideas. Depression: Denies. Nervous/anxious: Denies. Insomnia: Denies.     Blood pressure 120/58, pulse 79, temperature 98.1 F (36.7 C), temperature  source Oral, resp. rate 18, height  (1.803 m), weight 120.657 kg (266 lb), SpO2 100 %.Body mass index is 37.12 kg/(m^2).  General Appearance: Casual  Eye Contact:  Fair  Speech:  Clear and Coherent and Normal Rate  Volume:  Normal  Mood:  Anxious and Depressed  Affect:  Congruent  Thought Process:  Coherent  Orientation:  Full (Time, Place, and Person)  Thought Content:  Logical  Suicidal Thoughts:  No  Homicidal Thoughts:  No  Memory:  Immediate;   Fair Recent;   Fair Remote;   Fair  Judgement:  Fair  Insight:  Present  Psychomotor Activity:  Normal  Concentration:  Concentration: Fair and Attention Span: Fair  Recall:  [employer] of Knowledge:  Fair  Language:  Good  Akathisia:  No  Handed:  Right  AIMS (if indicated):     Assets:  Communication Skills Desire for Improvement  ADL's:  Intact  Cognition:  WNL  Sleep:  Number of Hours: 6.5     Treatment Plan Summary: Daily contact with patient to assess and evaluate symptoms and progress in treatment and Medication management    Observation Level/Precautions: 15 minute checks  Laboratory: CBC Chemistry Profile HbAIC UDS UA  See lab values above   Psychotherapy: Individual and group session  Medications:Discontinued Prozac 20 mg.  Patient refusing to take stating he does not needed.    Consultations: Psychiatry  Discharge Concerns: Safety, stabilization, and access to medication  Estimated LOS:  5-7 days  Other:    Continue to monitor for changes in behavior and mood.          Rankin, Shuvon, NP 08/06/2015, 2:43 PM I reviewed chart and agreed with the findings and treatment Plan.  Kathryne SharperSyed Daishon Chui, MD

## 2015-08-06 NOTE — Progress Notes (Signed)
Patient did attend the evening speaker AA meeting.  

## 2015-08-07 NOTE — Progress Notes (Signed)
Pt attended AA meeting this evening.  

## 2015-08-07 NOTE — Progress Notes (Addendum)
D: Patient pleasant upon approach.  He has been observed sitting in the day room.  He denies any depressive symptoms or self harm thoughts.  He denies HI/AVH.  He is focused on discharge.  He rates his depression and hopelessness; his anxiety as a 4.  Patient hopes to be discharged tomorrow.  He is restless and fidgety.  He has asked to see provider several times.  A: Continue to monitor medication management and MD orders.  Safety checks completed every 15 minutes per protocol. Offer support and encouragement as needed.  R: Patient is receptive to staff; his behavior is appropriate.

## 2015-08-07 NOTE — [facility] Group Notes (Signed)
[facility] LCSW Aftercare Discharge Planning Group Note  08/07/2015  8:45 AM  Participation Quality: Did Not Attend. Patient invited to participate but declined.  Sathvik Tiedt, MSW, LCSW Clinical Social Worker [facility] [phone]   

## 2015-08-07 NOTE — Progress Notes (Signed)
   D: Pt was observed interacting with his peers on the the unit. Was on the telephone when he asked if he could have his prn vistaril. Pt didn't appear to be anxious, but wanted the med. Pt was pleasant, however didn't go into any detail about his day to the writer.  Pt has no questions or concerns.   A:  Support and encouragement was offered. 15 min checks continued for safety.  R: Pt remains safe.

## 2015-08-07 NOTE — Progress Notes (Signed)
Patient ID: Charles Morton, male   DOB: 05-Aug-1995, 20 y.o.   MRN: [id number] [facility] MD Progress Note  08/07/2015 4:12 PM Charles Morton  MRN:  [id number]   Subjective:  "Id like to go home.  I broke up with my GF before I came and I guess I just got stressed out, she was my first love." Patient seen by this provider and chart reviewed 08/07/2015.  On evaluation:  Charles Morton reports that he is feeling better since he has had a chance to rest.  Reports that he has good support system.  He stated that it was an impulsive thing to take the Xanax.  He further stated that he is "in college, mom is a Midwife, I've got a 100 friends, my grandparents live across the street."  He was on a drug binge and was hallucinating which he is denying at this time.  Stated that his PCP managed the Prozac but feels strongly that he does need to take it.  Noted that he has been refusing.  At this time patient denies suicidal/homicidal ideation, psychosis, and paranoia.    Principal Problem: Depression, major, severe recurrence (HCC) Diagnosis:   Patient Active Problem List   Diagnosis Date Noted  . Substance induced mood disorder (HCC) [F19.94]   . Polysubstance abuse [F19.10]   . Depression [F32.9]   . Severe recurrent major depression without psychotic features (HCC) [F33.2] 08/04/2015  . Cocaine abuse [F14.10] 08/04/2015  . Depression, major, severe recurrence (HCC) [F33.2] 08/04/2015  . Mild obstructive sleep apnea [G47.33] 01/24/2015  . Obesity, Class III, BMI 40-49.9 (morbid obesity) (HCC) [E66.01] 01/24/2015  . Prediabetes [R73.03] 01/24/2015  . Fatty liver disease, nonalcoholic [K76.0] 78/29/5621  . Elevated blood pressure [R03.0] 01/24/2015  . Pilonidal cyst with abscess [L05.01] 07/20/2013   Total Time spent with patient: 25 minutes  Past Psychiatric History: Denies past psychiatric history  Past Medical History:  Past Medical History  Diagnosis Date  . Asthma   . Allergy   . ADD (attention  deficit disorder)   . Pilonidal cyst   . Acne   . GERD (gastroesophageal reflux disease)   . Hypertension   . Sleep apnea     mild sleep apnea and no cpap   . Diabetes mellitus without complication (HCC)     prediabetic on no meds   . Anxiety     Past Surgical History  Procedure Laterality Date  . None    . Pilonidal cyst excision N/A 09/09/2013    Procedure: CYST EXCISION PILONIDAL EXTENSIVE;  Surgeon: Shelly Rubenstein, MD;  Location: [facility];  Service: General;  Laterality: N/A;  . Laparoscopic gastric sleeve resection N/A 01/24/2015    Procedure: LAPAROSCOPIC GASTRIC SLEEVE RESECTION;  Surgeon: Gaynelle Adu, MD;  Location: [facility];  Service: General;  Laterality: N/A;  . Upper gi endoscopy  01/24/2015    Procedure: UPPER GI ENDOSCOPY;  Surgeon: Gaynelle Adu, MD;  Location: [facility];  Service: General;;   Family History: History reviewed. No pertinent family history. Family Psychiatric  History: Denies family history of metal illness Social History:  History  Alcohol Use No     History  Drug Use  . Yes  . Special: Cocaine    Social History   Social History  . Marital Status: Single    Spouse Name: N/A  . Number of Children: N/A  . Years of Education: N/A   Social History Main Topics  . Smoking status: Current Every Day Smoker --  0.25 packs/day    Types: Cigarettes  . Smokeless tobacco: Never Used  . Alcohol Use: No  . Drug Use: Yes    Special: Cocaine  . Sexual Activity: Yes   Other Topics Concern  . None   Social History Narrative   Additional Social History:       Sleep: Good  Appetite:  Good  Current Medications: Current Facility-Administered Medications  Medication Dose Route Frequency Provider Last Rate Last Dose  . acetaminophen (TYLENOL) tablet 650 mg  650 mg Oral Q6H PRN Charm Rings, NP      . alum & mag hydroxide-simeth (MAALOX/MYLANTA) 200-200-20 MG/5ML suspension 30 mL  30 mL Oral Q4H PRN Charm Rings, NP      . feeding supplement (ENSURE  ENLIVE) (ENSURE ENLIVE) liquid 237 mL  237 mL Oral BID BM Shuvon B Rankin, NP   237 mL at 08/07/15 1437  . FLUoxetine (PROZAC) capsule 20 mg  20 mg Oral Daily Charm Rings, NP   20 mg at 08/07/15 4098  . hydrOXYzine (ATARAX/VISTARIL) tablet 25 mg  25 mg Oral TID PRN Cleotis Nipper, MD   25 mg at 08/06/15 2133  . loratadine (CLARITIN) tablet 10 mg  10 mg Oral Daily Charm Rings, NP   10 mg at 08/07/15 0807  . magnesium hydroxide (MILK OF MAGNESIA) suspension 30 mL  30 mL Oral Daily PRN Charm Rings, NP        Lab Results: No results found for this or any previous visit (from the past 48 hour(s)).  Blood Alcohol level:  Lab Results  Component Value Date   ETH <5 08/03/2015    Physical Findings: AIMS: Facial and Oral Movements Muscles of Facial Expression: None, normal Lips and Perioral Area: None, normal Jaw: None, normal Tongue: None, normal,Extremity Movements Upper (arms, wrists, hands, fingers): None, normal Lower (legs, knees, ankles, toes): None, normal, Trunk Movements Neck, shoulders, hips: None, normal, Overall Severity Severity of abnormal movements (highest score from questions above): None, normal Incapacitation due to abnormal movements: None, normal Patient's awareness of abnormal movements (rate only patient's report): No Awareness, Dental Status Current problems with teeth and/or dentures?: No Does patient usually wear dentures?: No  CIWA:    COWS:     Musculoskeletal: Strength & Muscle Tone: within normal limits Gait & Station: normal Patient leans: N/A  Psychiatric Specialty Exam: Physical Exam  Nursing note and vitals reviewed. Constitutional: He is oriented to person, place, and time.  Neck: Normal range of motion.  Respiratory: Effort normal.  Musculoskeletal: Normal range of motion.  Neurological: He is alert and oriented to person, place, and time.  Skin: Skin is warm and dry.  Psychiatric: He is not agitated. Thought content is not paranoid. He  does not exhibit a depressed mood. He expresses no homicidal and no suicidal ideation.    Review of Systems  Psychiatric/Behavioral: Negative for suicidal ideas. Depression: Denies. Nervous/anxious: Denies. Insomnia: Denies.     Blood pressure 120/72, pulse 83, temperature 97.8 F (36.6 C), temperature source Oral, resp. rate 20, height  (1.803 m), weight 120.657 kg (266 lb), SpO2 100 %.Body mass index is 37.12 kg/(m^2).  General Appearance: Casual  Eye Contact:  Fair  Speech:  Clear and Coherent and Normal Rate  Volume:  Normal  Mood:  Anxious and Depressed  Affect:  Congruent  Thought Process:  Coherent  Orientation:  Full (Time, Place, and Person)  Thought Content:  Logical  Suicidal Thoughts:  No  Homicidal Thoughts:  No  Memory:  Immediate;   Fair Recent;   Fair Remote;   Fair  Judgement:  Fair  Insight:  Present  Psychomotor Activity:  Normal  Concentration:  Concentration: Fair and Attention Span: Fair  Recall:  Fis[employer]Fair  Fund of Knowledge:  Fair  Language:  Good  Akathisia:  No  Handed:  Right  AIMS (if indicated):     Assets:  Communication Skills Desire for Improvement  ADL's:  Intact  Cognition:  WNL  Sleep:  Number of Hours: 5.75   Treatment Plan Summary: Daily contact with patient to assess and evaluate symptoms and progress in treatment and Medication management  Patient discussed with Dr Jama Flavorsobos.  Patient to be discharged in the morning.       Observation Level/Precautions: 15 minute checks  Laboratory: CBC Chemistry Profile HbAIC UDS UA  See lab values above   Psychotherapy: Individual and group session  Medications:  Patient still refusing to take Prozac, stating he does not need it .    Consultations: Psychiatry  Discharge Concerns: Safety, stabilization, and access to medication  Estimated LOS:  5-7 days  Other:    Continue to monitor for changes in behavior and mood.        Velna HatchetSheila May Agustin, NP 08/07/2015, 4:12 PM Agree  with NP progress note as above

## 2015-08-08 MED ORDER — FLUOXETINE HCL 20 MG PO CAPS: 20.0000 mg | ORAL_CAPSULE | Freq: Every day | ORAL | Status: AC

## 2015-08-08 MED ORDER — CETIRIZINE HCL 10 MG PO TABS: 10.0000 mg | ORAL_TABLET | Freq: Every day | ORAL | Status: AC

## 2015-08-08 MED ORDER — HYDROXYZINE HCL 25 MG PO TABS: 25.0000 mg | ORAL_TABLET | Freq: Three times a day (TID) | ORAL | Status: AC | PRN

## 2015-08-08 NOTE — [facility] Group Notes (Signed)

## 2015-08-08 NOTE — [facility] Suicide Risk Assessment (Signed)
[facility] Discharge Suicide Risk Assessment   Principal Problem: Depression, major, severe recurrence [facility]) Discharge Diagnoses:  Patient Active Problem List   Diagnosis Date Noted  . Substance induced mood disorder (HCC) [F19.94]   . Polysubstance abuse [F19.10]   . Depression [F32.9]   . Severe recurrent major depression without psychotic features (HCC) [F33.2] 08/04/2015  . Cocaine abuse [F14.10] 08/04/2015  . Depression, major, severe recurrence (HCC) [F33.2] 08/04/2015  . Mild obstructive sleep apnea [G47.33] 01/24/2015  . Obesity, Class III, BMI 40-49.9 (morbid obesity) (HCC) [E66.01] 01/24/2015  . Prediabetes [R73.03] 01/24/2015  . Fatty liver disease, nonalcoholic [K76.0] 16/12/9602  . Elevated blood pressure [R03.0] 01/24/2015  . Pilonidal cyst with abscess [L05.01] 07/20/2013    Total Time spent with patient: 30 minutes  Musculoskeletal: Strength & Muscle Tone: within normal limits Gait & Station: normal Patient leans: N/A  Psychiatric Specialty Exam: ROS  Blood pressure 127/78, pulse 77, temperature 98 F (36.7 C), temperature source Oral, resp. rate 17, height  (1.803 m), weight 266 lb (120.657 kg), SpO2 100 %.Body mass index is 37.12 kg/(m^2).  General Appearance: Well Groomed  Eye Contact::  Good  Speech:  Normal Rate409  Volume:  normal  Mood:  improved mood, at this time states he is feeling " much better"  Affect:  Blunt and more reactive   Thought Process:  Linear  Orientation:  Full (Time, Place, and Person)  Thought Content:  no hallucinations, no delusions , not internally preoccupied   Suicidal Thoughts:  No denies any suicidal or self injurious ideations, denies any violent or homicidal ideations   Homicidal Thoughts:  No  Memory:  recent and remote grossly intact   Judgement: improved  Insight:  Present  Psychomotor Activity:  Normal  Concentration:  Good  Recall:  Good  Fund of Knowledge:Good  Language: Good  Akathisia:  Negative  Handed:   Right  AIMS (if indicated):     Assets:  Communication Skills Desire for Improvement Resilience Social Support  Sleep:  Number of Hours: 5  Cognition: WNL  ADL's:  Intact   Mental Status Per Nursing Assessment::   On Admission:  NA  Demographic Factors:  20 year old male, lives with mother, in college   Loss Factors: Recent break up with GF   Historical Factors: No prior psychiatric admissions, no history of prior suicide attempts, no prior severe depression, no substance abuse   Risk Reduction Factors:   Sense of responsibility to family, Living with another person, especially a relative, Positive coping skills or problem solving skills and in college   Continued Clinical Symptoms:  At this time he is alert, attentive, pleasant, calm, mood improved, affect fuller in range, no thought disorder, no SI, no HI, no psychotic symptoms, and future oriented, looking forward to returning home to family. Denies medication side effects, we have discussed side effect profile to include potential risk of suicidal ideations early in treatment with antidepressants in young adults and potential for sexual side effects.   Cognitive Features That Contribute To Risk:  No gross cognitive deficits noted upon discharge. Is alert , attentive, and oriented x 3   Suicide Risk:  Mild:  Suicidal ideation of limited frequency, intensity, duration, and specificity.  There are no identifiable plans, no associated intent, mild dysphoria and related symptoms, good self-control (both objective and subjective assessment), few other risk factors, and identifiable protective factors, including available and accessible social support.  Follow-up Information    Follow up with [facility].  Why:  Therapy app[facility] on Thursday June [facility]th at 2pm [facility]. Medication management appt on Wednesday Aug. 9th at 3pm with Donnie Ahoobin Bridges. You are on cancellation list if earlier appt becomes available. Call if  you need to reschedule.    Contact information:   [phone] [address] [place], [place] [address]       Plan Of Care/Follow-up recommendations:  Activity:  as tolerated  Diet:  Regular  Tests:  NA Other:  see below Patient is leaving in good spirits  Patient plans to return home Follow up as above  Nehemiah MassedOBOS, Rafaela Dinius, MD 08/08/2015, 11:01 AM

## 2015-08-08 NOTE — Progress Notes (Signed)
Discharge note:  Patient discharged home per MD order.  Patient received all belongings from room.  He did not have any belongings in a locker.  He received prescriptions.  Reviewed AVS/discharge instructions with patient and he indicated understanding.  He denies SI/HI/AVH.  Patient left ambulatory with his mother.

## 2015-08-08 NOTE — [facility] Suicide Risk Assessment (Signed)
[facility] INPATIENT:  Family/Significant Other Suicide Prevention Education  Suicide Prevention Education:  Education Completed; mother Creta Levinmanda Raftery [phone],  (name of family member/significant other) has been identified by the patient as the family member/significant other with whom the patient will be residing, and identified as the person(s) who will aid the patient in the event of a mental health crisis (suicidal ideations/suicide attempt).  With written consent from the patient, the family member/significant other has been provided the following suicide prevention education, prior to the and/or following the discharge of the patient.  The suicide prevention education provided includes the following:  Suicide risk factors  Suicide prevention and interventions  National Suicide Hotline telephone number  [facility] assessment telephone number  [facility] Emergency Assistance 911  [facility] and/or Residential Mobile Crisis Unit telephone number  Request made of family/significant other to:  Remove weapons (e.g., guns, rifles, knives), all items previously/currently identified as safety concern.    Remove drugs/medications (over-the-counter, prescriptions, illicit drugs), all items previously/currently identified as a safety concern.  The family member/significant other verbalizes understanding of the suicide prevention education information provided.  The family member/significant other agrees to remove the items of safety concern listed above.  Homer Miller, [place]ristin L 08/08/2015, 10:31 AM

## 2015-08-08 NOTE — Progress Notes (Signed)
D. Pt had been up and visible in milieu this evening, attended and participated in evening group activity. Pt seen interacting appropriately with peers. Pt did not verbalize any complaints of pain this evening and did not request or receive any medications. A. Support and encouragement provided. R. Safety maintained, will continue to monitor. 
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
## 2015-08-08 NOTE — Progress Notes (Signed)
  [facility] Discharge Plan :  Will you be returning to the same living situation after discharge:  Yes,  patient plans to return home At discharge, do you have transportation home?: Yes,  mother  Do you have the ability to pay for your medications: Yes,  patient will be provided with prescriptions at discharge  Release of information consent forms completed and in the chart;  Patient's signature needed at discharge.  Patient to Follow up at: Follow-up Information    [facility].   Why:  Therapy app[facility] on Thursday June [facility]th [facility]pm [facility]. Medication management appt on Wednesday Aug. 9th at 3pm with Donnie Ahoobin Bridges. You are on cancellation list if earlier appt becomes available. Call if you need to reschedule.    Contact information:   [phone] [address] [place], [place] [address]       Next level of care provider has access to [facility] Link:no  Safety Planning and Suicide Prevention discussed: Yes,  with patient and mother  Have you used any form of tobacco in the last 30 days? (Cigarettes, Smokeless Tobacco, Cigars, and/or Pipes): Yes  Has patient been referred to the Quitline?: Patient refused referral  Patient has been referred for addiction treatment: Yes  Mashal Slavick, [place]ristin L 08/08/2015, 10:32 AM

## 2015-08-08 NOTE — Discharge Summary (Signed)
Physician Discharge Summary Note  Patient:  Charles Morton is an 20 y.o., male MRN:  [id number] DOB:  19-Aug-1995 Patient phone:  [phone] (home)  Patient address:   [address] [place] [id number],  Total Time spent with patient: Greater than 30 minutes  Date of Admission:  08/04/2015  Date of Discharge: 08-08-15  Reason for Admission:    Principal Problem: Depression, major, severe recurrence [name](HCC)  Discharge Diagnoses: Patient Active Problem List   Diagnosis Date Noted  . Substance induced mood disorder (HCC) [F19.94]   . Polysubstance abuse [F19.10]   . Depression [F32.9]   . Severe recurrent major depression without psychotic features (HCC) [F33.2] 08/04/2015  . Cocaine abuse [F14.10] 08/04/2015  . Depression, major, severe recurrence (HCC) [F33.2] 08/04/2015  . Mild obstructive sleep apnea [G47.33] 01/24/2015  . Obesity, Class III, BMI 40-49.9 (morbid obesity) (HCC) [E66.01] 01/24/2015  . Prediabetes [R73.03] 01/24/2015  . Fatty liver disease, nonalcoholic [K76.0] 62/95/284111/22/2016  . Elevated blood pressure [R03.0] 01/24/2015  . Pilonidal cyst with abscess [L05.01] 07/20/2013   Past Psychiatric History: Hx. od Major depression, polysubstance dependence  Past Medical History:  Past Medical History  Diagnosis Date  . Asthma   . Allergy   . ADD (attention deficit disorder)   . Pilonidal cyst   . Acne   . GERD (gastroesophageal reflux disease)   . Hypertension   . Sleep apnea     mild sleep apnea and no cpap   . Diabetes mellitus without complication (HCC)     prediabetic on no meds   . Anxiety     Past Surgical History  Procedure Laterality Date  . None    . Pilonidal cyst excision N/A 09/09/2013    Procedure: CYST EXCISION PILONIDAL EXTENSIVE;  Surgeon: Shelly Rubensteinouglas A Blackman, MD;  Location: [facility];  Service: General;  Laterality: N/A;  . Laparoscopic gastric sleeve resection N/A 01/24/2015    Procedure: LAPAROSCOPIC GASTRIC SLEEVE RESECTION;  Surgeon: Gaynelle AduEric  [name], MD;  Location: [facility];  Service: General;  Laterality: N/A;  . Upper gi endoscopy  01/24/2015    Procedure: UPPER GI ENDOSCOPY;  Surgeon: Gaynelle AduEric [name], MD;  Location: [facility];  Service: General;;   Family History: History reviewed. No pertinent family history.  Family Psychiatric  History: See H&P  Social History:  History  Alcohol Use No     History  Drug Use  . Yes  . Special: Cocaine    Social History   Social History  . Marital Status: Single    Spouse Name: N/A  . Number of Children: N/A  . Years of Education: N/A   Social History Main Topics  . Smoking status: Current Every Day Smoker -- 0.25 packs/day    Types: Cigarettes  . Smokeless tobacco: Never Used  . Alcohol Use: No  . Drug Use: Yes    Special: Cocaine  . Sexual Activity: Yes   Other Topics Concern  . None   Social History Narrative   Hospital Course:  20 y.o. male [facility] after an overdose on Xanax. Pt stated "I took 6  Xanax bars". "I have never done this before". Pt reported that he broke up with his girlfriend after dating for 1  years. Pt denies that the overdose was a suicide attempt; however pt stated repeated "I wanted to take enough so that I could forget about all the physical pain I am in". "If I wanted to commit suicide there was shot gun, glock and AR next  to the pill bottle". Pt did not report any previous suicide attempts or self-injurious behaviors. Pt denies HI and AVH at this time. Pt reported that he is currently receiving medication management and reported that he would like his Adderall dosage increase so that it would last him throughout the day. Pt did not report a trauma history.  Charles Morton was admitted to the hospital for worsening symptoms of depression & crisis management due to suspected suicide attempt by overdose. However, during admission assessment, he denied trying to commit suicide rather took some 6.5 tablet of Xanax pills to sleep so to forget his pain from his  relationship break-up. Charles Morton narrated other ways he could have killed himself if that was the case. He was apparently receiving some mental health care on an outpatient basis prior to his admission. He was in need of mood stabilization treatments. After his admission assessment, his presenting symptoms were identified. The medication regimen targeting those symptoms were initiated. Charles Morton was medicated & discharged on; Fluoxetine 20 mg for depression & Hydroxyzine 25 mg prn for anxiety. His other pre-existing medical problems were identified, his home medication for that medical issues restarted. He tolerated his treatment regimen without any adverse effects or reactions reported. Charles Morton was enrolled & participated in the group counseling sessions being offered & held on this unit. He learned coping skills..  During the course of his hospitalization, Charles Morton's improvement was monitored by observation & his daily report of symptom reduction noted. His emotional and mental status were monitored by daily self-inventory reports completed by him & the clinical staff. He was evaluated by the treatment team for mood stability & plans for continued recovery after discharge. Charles Morton's motivation was an integral factor in his mood stability. He was offered further treatment options upon discharge to on an outpatient basis.,    Upon discharge, Charles Morton was both mentally & medically stable. He denies suicidal/homicidal ideations, auditory/visual/tactile hallucinations, delusional thoughts or paranoia.  He left [facility] with all belongings in no distress. Transportation per mother.  Physical Findings: AIMS: Facial and Oral Movements Muscles of Facial Expression: None, normal Lips and Perioral Area: None, normal Jaw: None, normal Tongue: None, normal,Extremity Movements Upper (arms, wrists, hands, fingers): None, normal Lower (legs, knees, ankles, toes): None, normal, Trunk Movements Neck, shoulders, hips: None, normal, Overall  Severity Severity of abnormal movements (highest score from questions above): None, normal Incapacitation due to abnormal movements: None, normal Patient's awareness of abnormal movements (rate only patient's report): No Awareness, Dental Status Current problems with teeth and/or dentures?: No Does patient usually wear dentures?: No  CIWA:    COWS:     Musculoskeletal: Strength & Muscle Tone: within normal limits Gait & Station: normal Patient leans: N/A  Psychiatric Specialty Exam: Physical Exam  Constitutional: He is oriented to person, place, and time. He appears well-developed.  Obese  HENT:  Head: Normocephalic.  Eyes: Pupils are equal, round, and reactive to light.  Neck: Normal range of motion.  Cardiovascular: Normal rate.   Respiratory: Effort normal.  Hx. Sleep apnea  GI: Soft.  Genitourinary:  Denies any issues in this area  Musculoskeletal: Normal range of motion.  Neurological: He is alert and oriented to person, place, and time.  Skin: Skin is warm and dry.  Psychiatric: His speech is normal and behavior is normal. Judgment and thought content normal. His mood appears not anxious. His affect is not angry, not blunt, not labile and not inappropriate. Cognition and memory are normal. He does not exhibit a depressed  mood.    Review of Systems  Constitutional: Negative.   Eyes: Negative.   Respiratory: Negative.   Cardiovascular: Negative.        Hx. Sleep apnea  Gastrointestinal: Negative.   Genitourinary: Negative.   Musculoskeletal: Negative.   Skin: Negative.   Neurological: Negative.   Endo/Heme/Allergies: Negative.   Psychiatric/Behavioral: Positive for depression (Stable) and substance abuse (Hx. ploysubstance dependence). Negative for suicidal ideas, hallucinations and memory loss. The patient is not nervous/anxious and does not have insomnia.     Blood pressure 127/78, pulse 77, temperature 98 F (36.7 C), temperature source Oral, resp. rate 17,  height 5\' 11"  (1.803 m), weight 120.657 kg (266 lb), SpO2 100 %.Body mass index is 37.12 kg/(m^2).  See Md's SRA   Have you used any form of tobacco in the last 30 days? (Cigarettes, Smokeless Tobacco, Cigars, and/or Pipes): Yes  Has this patient used any form of tobacco in the last 30 days? (Cigarettes, Smokeless Tobacco, Cigars, and/or Pipes): No  Blood Alcohol level:  Lab Results  Component Value Date   ETH <5 08/03/2015   Metabolic Disorder Labs:  Lab Results  Component Value Date   HGBA1C  11/19/2007    5.7 (NOTE)   The ADA recommends the following therapeutic goal for glycemic   control related to Hgb A1C measurement:   Goal of Therapy:   < 7.0% Hgb A1C   Reference: American Diabetes Association: Clinical Practice   Recommendations 2008, Diabetes Care,  2008, 31:(Suppl 1).   MPG 117 11/19/2007   Lab Results  Component Value Date   PROLACTIN  11/19/2007    7.2 (NOTE)     Reference Ranges:                 Male:                       2.1 -  17.1 ng/ml                 Male:   Pregnant          9.7 - 208.5 ng/mL                           Non Pregnant      2.8 -  29.2 ng/mL                           Post  Menopausal   1.8 -  20.3 ng/mL                     Lab Results  Component Value Date   CHOL  11/19/2007    137        ATP III CLASSIFICATION:  <200     mg/dL   Desirable  409-811  mg/dL   Borderline High  >=914    mg/dL   High   TRIG 782 95/62/1308   HDL 39 11/19/2007   CHOLHDL 3.5 11/19/2007   VLDL 23 11/19/2007   LDLCALC  11/19/2007    75        Total Cholesterol/HDL:CHD Risk Coronary Heart Disease Risk Table                     Men   Women  1/2 Average Risk   3.4   3.3   See Psychiatric Specialty Exam and Suicide Risk Assessment completed by Attending  Physician prior to discharge.  Discharge destination:  Home  Is patient on multiple antipsychotic therapies at discharge:  No   Has Patient had three or more failed trials of antipsychotic monotherapy by history:   No  Recommended Plan for Multiple Antipsychotic Therapies: NA    Medication List    STOP taking these medications        rizatriptan 10 MG tablet  Commonly known as:  MAXALT      TAKE these medications      Indication   cetirizine 10 MG tablet  Commonly known as:  ZYRTEC  Take 1 tablet (10 mg total) by mouth daily. For allergies   Indication:  Perennial Rhinitis, Hayfever     FLUoxetine 20 MG capsule  Commonly known as:  PROZAC  Take 1 capsule (20 mg total) by mouth daily. For depression   Indication:  Major Depressive Disorder     hydrOXYzine 25 MG tablet  Commonly known as:  ATARAX/VISTARIL  Take 1 tablet (25 mg total) by mouth 3 (three) times daily as needed for anxiety.   Indication:  Anxiety       Follow-up recommendations: Activity:  As tolerated Diet: As recommended by your primary care doctor. Keep all scheduled follow-up appointments as recommended.   Comments: Patient is instructed prior to discharge to: Take all medications as prescribed by his/her mental healthcare provider. Report any adverse effects and or reactions from the medicines to his/her outpatient provider promptly. Patient has been instructed & cautioned: To not engage in alcohol and or illegal drug use while on prescription medicines. In the event of worsening symptoms, patient is instructed to call the crisis hotline, 911 and or go to the nearest ED for appropriate evaluation and treatment of symptoms. To follow-up with his/her primary care provider for your other medical issues, concerns and or health care needs.   Signed: Sanjuana Kava, NP, PMHNP, FNP-BC 08/08/2015, 9:47 AM  Patient seen, Suicide Assessment Completed.  Disposition Plan Reviewed

## 2015-10-11 ENCOUNTER — Encounter: Payer: 59 | Attending: General Surgery | Admitting: Dietician

## 2015-10-11 ENCOUNTER — Encounter: Payer: Self-pay | Admitting: Dietician

## 2015-10-11 DIAGNOSIS — K219 Gastro-esophageal reflux disease without esophagitis: Secondary | ICD-10-CM | POA: Diagnosis not present

## 2015-10-11 DIAGNOSIS — E66813 Obesity, class 3: Secondary | ICD-10-CM

## 2015-10-11 DIAGNOSIS — G4733 Obstructive sleep apnea (adult) (pediatric): Secondary | ICD-10-CM | POA: Insufficient documentation

## 2015-10-11 DIAGNOSIS — Z7984 Long term (current) use of oral hypoglycemic drugs: Secondary | ICD-10-CM | POA: Diagnosis not present

## 2015-10-11 DIAGNOSIS — Z6830 Body mass index (BMI) 30.0-30.9, adult: Secondary | ICD-10-CM | POA: Diagnosis not present

## 2015-10-11 DIAGNOSIS — Z713 Dietary counseling and surveillance: Secondary | ICD-10-CM | POA: Diagnosis present

## 2015-10-11 DIAGNOSIS — E669 Obesity, unspecified: Secondary | ICD-10-CM | POA: Insufficient documentation

## 2015-10-11 DIAGNOSIS — R7303 Prediabetes: Secondary | ICD-10-CM | POA: Insufficient documentation

## 2015-10-11 DIAGNOSIS — K760 Fatty (change of) liver, not elsewhere classified: Secondary | ICD-10-CM | POA: Diagnosis not present

## 2015-10-11 NOTE — Progress Notes (Signed)
  Follow-up visit:  9 Months Post-Operative Sleeve Gastrectomy Surgery  Medical Nutrition Therapy:  Appt start time: 1140 end time:  1215.  Primary concerns today: Post-operative Bariatric Surgery Nutrition Management. Returns with a 65.9 lb weight loss since November (pre op). Did not attend post op class. No issues tolerating foods. Not missing or skipping meals. Has been feeling hungry every 2 hours and getting full quickly. Eating protein first and then vegetables if he has room. Sometimes has fried chicken. Having no carbs.   Surgery date: 01/24/15 Surgery type: Sleeve gastrectomy [facility]: 318 lbs on 10/08/14, 333.5 lbs on 01/09/2015 Weight today: 267.6 lbs  Weight change: 65.9 lbs, 82.4 lbs of fat mass  TANITA  BODY COMP RESULTS  01/09/15 10/11/15   BMI (kg/m^2) 49.3 39.5   Fat Mass (lbs) 206 123.6   Fat Free Mass (lbs) 127.5 144.0   Total Body Water (lbs) 93.5 N/A    Preferred Learning Style:   No preference indicated   Learning Readiness:   Ready  24-hr recall: B (AM): 1 egg with chicken/ham/salmon or sausage or part of an omelette (14 oz) Snk (AM): Isogenix (30 g)  L (PM): salad with 3 oz chicken (sometimes fried), eggs, cheese, corn (28 g) Snk (PM): none or 3 grilled chicken nuggets  (0-10 g)  Snk (PM): post workout shake on workout days(30 g)  D (PM): 4-6 oz salmon, grilled chicken and greens (28-42 g) Snk (PM): none   Fluid intake: over 100 oz water, lightly sweet tea, G2 gatorade Estimated total protein intake: over 130 g per day on workout days  Medications: see list  Supplementation: taking  Using straws: yes with protein shake sometimes Drinking while eating: sips sometimes  Hair loss: No Carbonated beverages: No N/V/D/C: No Dumping syndrome: No  Recent physical activity:  15 minutes elliptical, 20-30 minutes weight training 4-7 x week   Progress Towards Goal(s):  In progress.  Handouts given during visit include:  none   Nutritional  Diagnosis:  Charles Morton-3.3 Overweight/obesity related to past poor dietary habits and physical inactivity as evidenced by patient w/ recent sleeve gastrectomy surgery following dietary guidelines for continued weight loss.    Intervention:  Nutrition counseling provided. Goals:  Follow Phase 3B: High Protein + Non-Starchy Vegetables  Eat 3-6 small meals/snacks, every 3-5 hrs  Increase lean protein foods to meet 80g goal  Increase fluid intake to 64oz +  Avoid drinking 15 minutes before, during and 30 minutes after eating  Aim for >30 min of physical activity daily  Just use IsoPro and try 1 scoop if you're not very hungry  Try to have just 4 oz of protein at dinner and make sure to have vegetable  Teaching Method Utilized:  Visual Auditory Hands on  Barriers to learning/adherence to lifestyle change: none  Demonstrated degree of understanding via:  Teach Back   Monitoring/Evaluation:  Dietary intake, exercise, and body weight. Follow up in 3 months for 12 month post-op visit.

## 2015-10-11 NOTE — Patient Instructions (Addendum)
Goals:  Follow Phase 3B: High Protein + Non-Starchy Vegetables  Eat 3-6 small meals/snacks, every 3-5 hrs  Increase lean protein foods to meet 80g goal  Increase fluid intake to 64oz +  Avoid drinking 15 minutes before, during and 30 minutes after eating  Aim for >30 min of physical activity daily  Just use IsoPro and try 1 scoop if you're not very hungry  Try to have just 4 oz of protein at dinner and make sure to have vegetable  Surgery date: 01/24/15 Surgery type: Sleeve gastrectomy [facility]: [facility] lbs on 10/08/14 Weight today: 267.6 lbs  Weight change: 65.9 lbs, 82.4 lbs of fat mass  TANITA  BODY COMP RESULTS  01/09/15 10/11/15   BMI (kg/m^2) 49.3 39.5   Fat Mass (lbs) 206 123.6   Fat Free Mass (lbs) 127.5 144.0   Total Body Water (lbs) 93.5 N/A

## 2016-01-16 ENCOUNTER — Encounter: Payer: 59 | Attending: General Surgery | Admitting: Dietician

## 2016-01-16 DIAGNOSIS — G4733 Obstructive sleep apnea (adult) (pediatric): Secondary | ICD-10-CM | POA: Insufficient documentation

## 2016-01-16 DIAGNOSIS — Z6830 Body mass index (BMI) 30.0-30.9, adult: Secondary | ICD-10-CM | POA: Insufficient documentation

## 2016-01-16 DIAGNOSIS — K219 Gastro-esophageal reflux disease without esophagitis: Secondary | ICD-10-CM | POA: Diagnosis not present

## 2016-01-16 DIAGNOSIS — E669 Obesity, unspecified: Secondary | ICD-10-CM | POA: Diagnosis not present

## 2016-01-16 DIAGNOSIS — Z713 Dietary counseling and surveillance: Secondary | ICD-10-CM | POA: Diagnosis present

## 2016-01-16 DIAGNOSIS — Z7984 Long term (current) use of oral hypoglycemic drugs: Secondary | ICD-10-CM | POA: Diagnosis not present

## 2016-01-16 DIAGNOSIS — K760 Fatty (change of) liver, not elsewhere classified: Secondary | ICD-10-CM | POA: Diagnosis not present

## 2016-01-16 DIAGNOSIS — R7303 Prediabetes: Secondary | ICD-10-CM | POA: Diagnosis not present

## 2016-01-16 NOTE — Progress Notes (Signed)
  Follow-up visit:  11 Months Post-Operative Sleeve Gastrectomy Surgery  Medical Nutrition Therapy:  Appt start time: 315 end time:  345  Primary concerns today: Post-operative Bariatric Surgery Nutrition Management. Charles Morton returns with his mom having lost a total of 75 pounds in the last year. He has increased strength training and states he would like to maintain strength and lose fat. Charles Morton is sometimes having carbs like mac and cheese and sweet tea but otherwise meeting his protein and fluid needs.  Surgery date: 01/24/15 Surgery type: Sleeve gastrectomy [facility]: 318 lbs on 10/08/14, 333.5 lbs on 01/09/2015 Weight today: 259 lbs Weight change: 6 lbs Total weight lost: 74.5 lbs  TANITA  BODY COMP RESULTS  01/09/15 10/11/15 01/16/16   BMI (kg/m^2) 49.3 39.5 38.2   Fat Mass (lbs) 206 123.6 111.4   Fat Free Mass (lbs) 127.5 144.0 147.6   Total Body Water (lbs) 93.5 N/A 119.8    Preferred Learning Style:   No preference indicated   Learning Readiness:   Ready  24-hr recall: B (9am-12pm): Isagenix protein shake (24g) Snk (AM):  L (PM): 4-6 oz chicken, fish, or steak, sometimes a salad (28-35g) Snk (PM): post workout shake on workout days (24 g)  D (PM): 4-6 oz salmon, grilled chicken and greens (28-42 g) Snk (PM): none   Fluid intake: 100+ oz water, G2 gatorade, sometimes decaf sweet tea Estimated total protein intake: over 130 g per day on workout days  Medications: see list  Supplementation: taking  Using straws: yes with protein shake sometimes Drinking while eating: sips sometimes  Hair loss: No Carbonated beverages: No N/V/D/C: No Dumping syndrome: No  Recent physical activity: weight lifting for 1-2 hours   Progress Towards Goal(s):  In progress.  Handouts given during visit include:  none   Nutritional Diagnosis:  Blanco-3.3 Overweight/obesity related to past poor dietary habits and physical inactivity as evidenced by patient w/ recent sleeve  gastrectomy surgery following dietary guidelines for continued weight loss.    Intervention:  Nutrition counseling provided.  Teaching Method Utilized:  Visual Auditory Hands on  Barriers to learning/adherence to lifestyle change: none  Demonstrated degree of understanding via:  Teach Back   Monitoring/Evaluation:  Dietary intake, exercise, and body weight. Follow up prn.

## 2016-01-16 NOTE — Patient Instructions (Addendum)
Goals:  Follow Phase 3B: High Protein + Non-Starchy Vegetables  Eat 3-6 small meals/snacks, every 3-5 hrs  Increase lean protein foods to meet 80g goal  Increase fluid intake to 64oz +  Avoid drinking 15 minutes before, during and 30 minutes after eating  Aim for >30 min of physical activity daily  Just use IsoPro and try 1 scoop if you're not very hungry  Try to have just 4 oz of protein at dinner and make sure to have vegetable  Increase cardio!  Surgery date: 01/24/15 Surgery type: Sleeve gastrectomy [facility] lbs on 10/08/14, 333.5 lbs on 01/09/2015 Weight today: 259 lbs Weight change: 6 lbs Total weight lost: 74.5 lbs  TANITA  BODY COMP RESULTS  01/09/15 10/11/15 01/16/16   BMI (kg/m^2) 49.3 39.5 38.2   Fat Mass (lbs) 206 123.6 111.4   Fat Free Mass (lbs) 127.5 144.0 147.6   Total Body Water (lbs) 93.5 N/A 119.8

## 2016-01-17 ENCOUNTER — Encounter: Payer: Self-pay | Admitting: Dietician

## 2016-07-10 ENCOUNTER — Telehealth: Payer: 59 | Admitting: Family

## 2016-07-10 DIAGNOSIS — L500 Allergic urticaria: Secondary | ICD-10-CM

## 2016-07-10 MED ORDER — PREDNISONE 10 MG (21) PO TBPK: ORAL_TABLET | ORAL | 0 refills | Status: DC

## 2016-07-10 NOTE — Progress Notes (Signed)
E Visit for Rash  We are sorry that you are not feeling well. Here is how we plan to help!   Based on what you shared with me you may have a virus or an allergic reaction.  Avoid contact with pregnant women until a diagnosis is made.  Most viral rashes are contagious (especially if a fever is present).  You can return to work or school after the rash is gone or when your doctor says it is safe to return with the rash.    I recommend you take Benadryl 25 mg - 50 mg every 4 hours to control the symptoms but if they last over 24 hours it is best that you see an office based provider for follow up.  Sterapred 10 mg dose pak     HOME CARE:   Take cool showers and avoid direct sunlight.  Apply cool compress or wet dressings.  Take a bath in an oatmeal bath.  Sprinkle content of one Aveeno packet under running faucet with comfortably warm water.  Bathe for 15-20 minutes, 1-2 times daily.  Pat dry with a towel. Do not rub the rash.  Use hydrocortisone cream.  Take an antihistamine like Benadryl for widespread rashes that itch.  The adult dose of Benadryl is 25-50 mg by mouth 4 times daily.  Caution:  This type of medication may cause sleepiness.  Do not drink alcohol, drive, or operate dangerous machinery while taking antihistamines.  Do not take these medications if you have prostate enlargement.  Read package instructions thoroughly on all medications that you take.  GET HELP RIGHT AWAY IF:   Symptoms don't go away after treatment.  Severe itching that persists.  If you rash spreads or swells.  If you rash begins to smell.  If it blisters and opens or develops a yellow-brown crust.  You develop a fever.  You have a sore throat.  You become short of breath.  MAKE SURE YOU:  Understand these instructions. Will watch your condition. Will get help right away if you are not doing well or get worse.  Thank you for choosing an e-visit. Your e-visit answers were reviewed by  a board certified advanced clinical practitioner to complete your personal care plan. Depending upon the condition, your plan could have included both over the counter or prescription medications. Please review your pharmacy choice. Be sure that the pharmacy you have chosen is open so that you can pick up your prescription now.  If there is a problem you may message your provider in MyChart to have the prescription routed to another pharmacy. Your safety is important to us. If you have drug allergies check your prescription carefully.  For the next 24 hours, you can use MyChart to ask questions about today's visit, request a non-urgent call back, or ask for a work or school excuse from your e-visit provider. You will get an email in the next two days asking about your experience. I hope that your e-visit has been valuable and will speed your recovery.

## 2016-07-15 DIAGNOSIS — G43019 Migraine without aura, intractable, without status migrainosus: Secondary | ICD-10-CM | POA: Diagnosis not present

## 2016-07-15 DIAGNOSIS — G44219 Episodic tension-type headache, not intractable: Secondary | ICD-10-CM | POA: Diagnosis not present

## 2016-08-03 IMAGING — RF DG UGI W/ GASTROGRAFIN
5 series · 11 of 11 positions shown · IV contrast (omnipaque)
Comparison: 09/27/2014.

CLINICAL DATA: Status post gastric sleeve procedure.

EXAM:
WATER SOLUBLE UPPER GI SERIES
TECHNIQUE: Single-column upper GI series was performed using water soluble
contrast.
CONTRAST:  50mL OMNIPAQUE IOHEXOL 300 MG/ML  SOLN

[Series 1: t abdomen supine · 0.15mm/px · 1 of 1 slices shown]
[im 1/1]
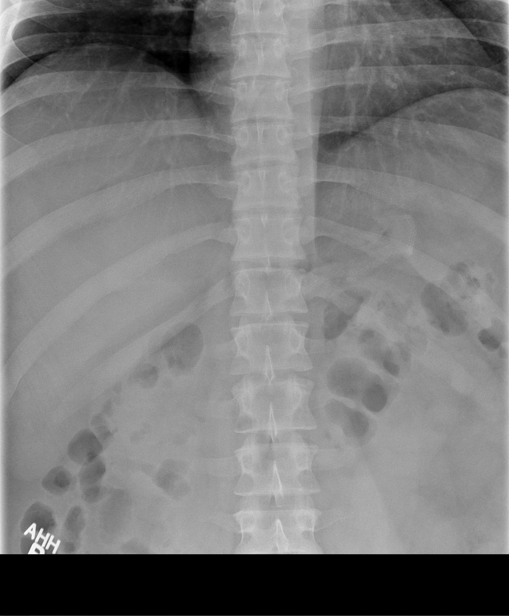

[Series 2: fluoro_barium 2fps_bw · 0.18mm/px · 4 of 19 frames shown (1 of 4)]
[frame 3/19]
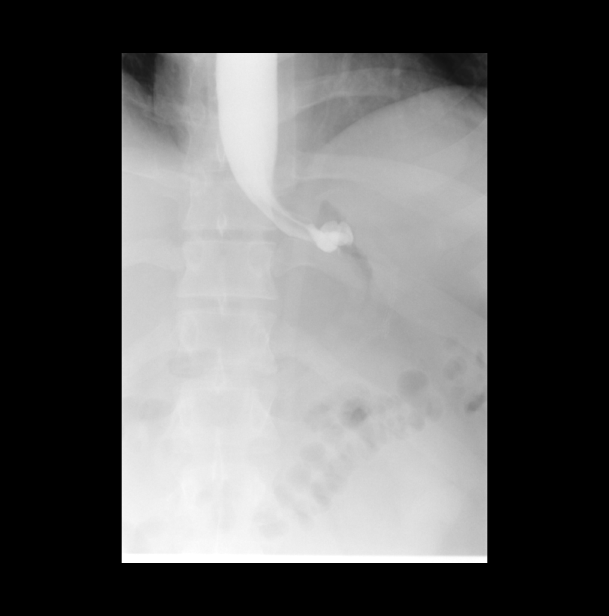
[frame 7/19]
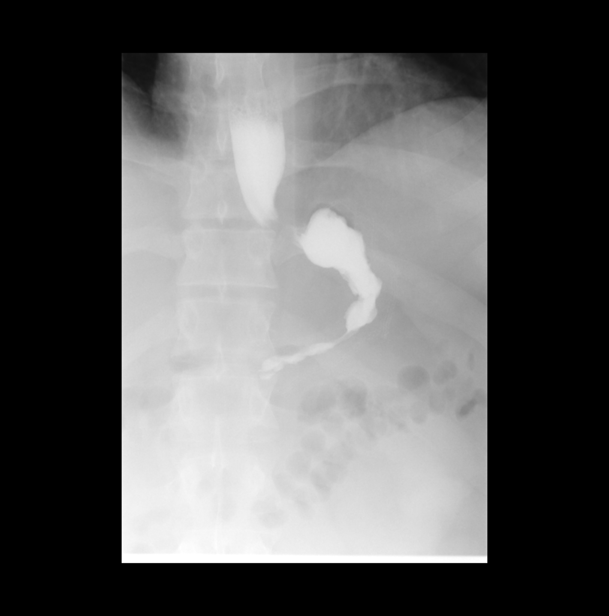
[frame 10/19]
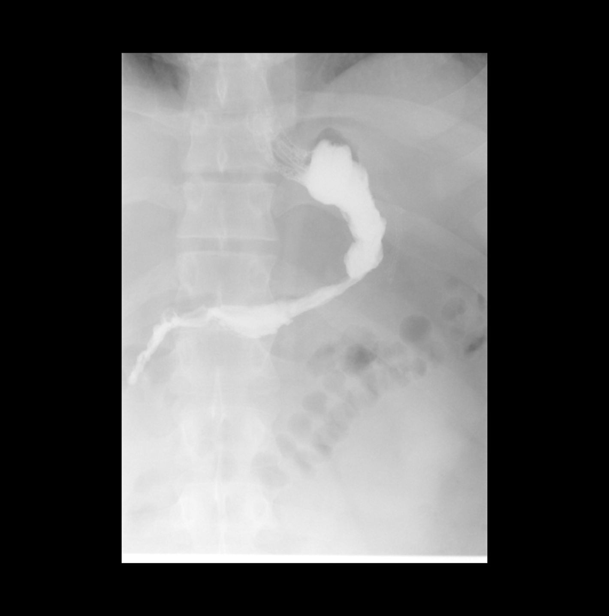
[frame 17/19]
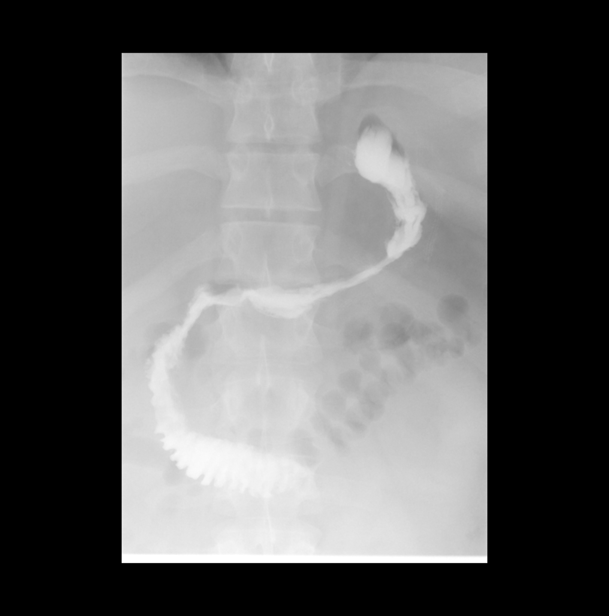

[Series 3: fluoro_barium 2fps_bw · 0.18mm/px · 4 of 18 frames shown (2 of 4)]
[frame 3/18]
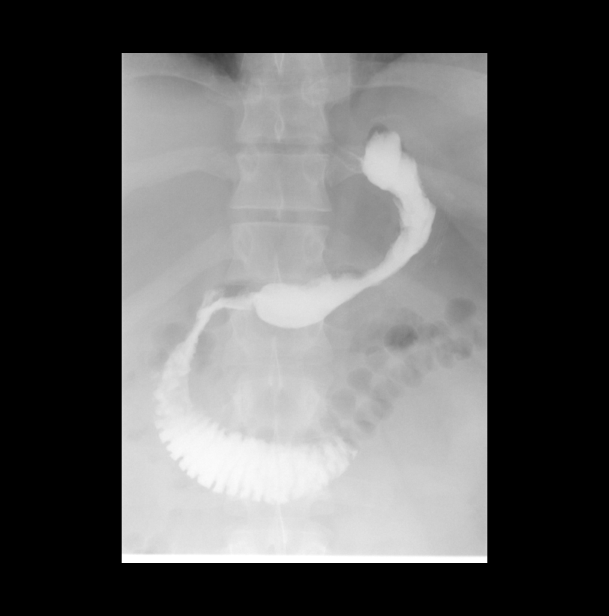
[frame 10/18]
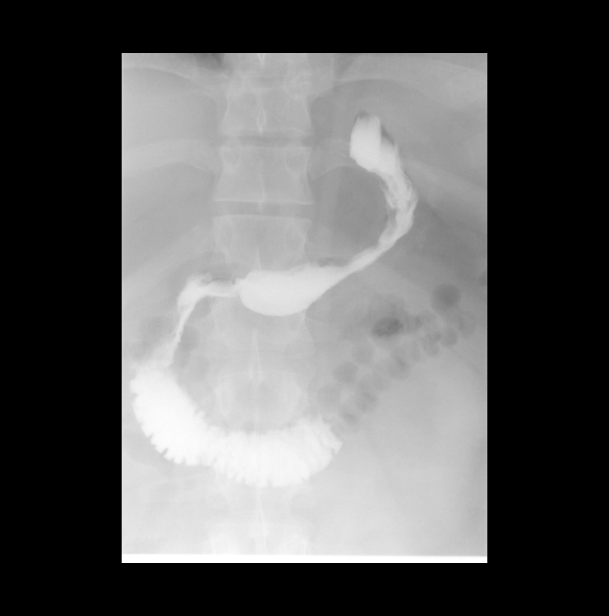
[frame 13/18]
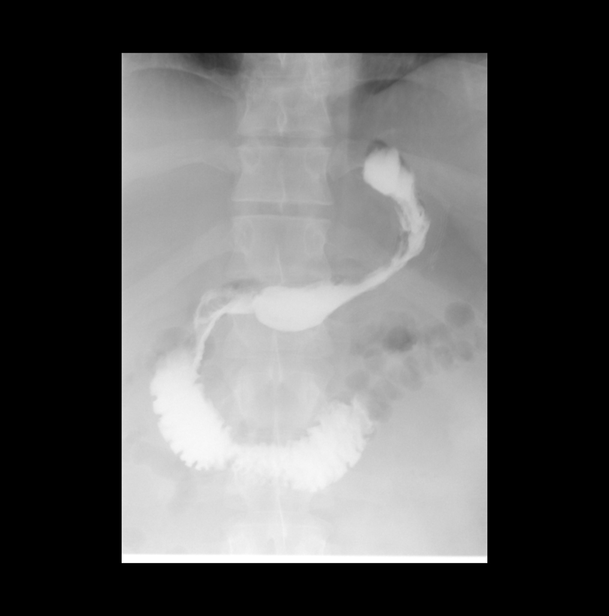
[frame 16/18]
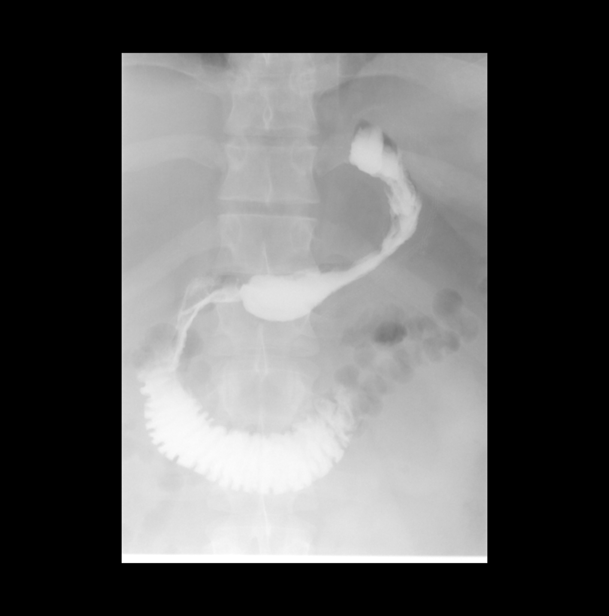

[Series 4: fluoro_barium 2fps_bw · 0.18mm/px · 1 of 1 slices shown (3 of 4)]
[im 1/1]
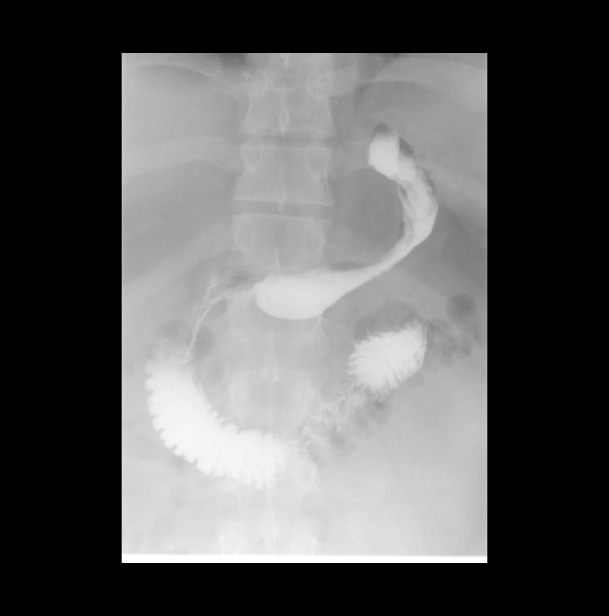

[Series 5: fluoro_barium 2fps_bw · 0.18mm/px · 1 of 1 slices shown (4 of 4)]
[im 1/1]
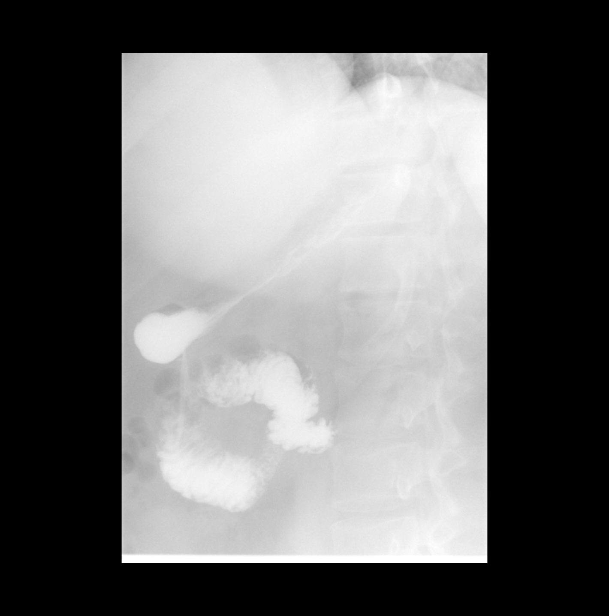

[11 of 11 positions shown; findings below may reference images not displayed]

FLUOROSCOPY TIME:  Radiation Exposure Index (as provided by the
fluoroscopic device):

If the device does not provide the exposure index:

Fluoroscopy Time (in minutes and seconds): 0 minutes and 24 seconds.

Number of Acquired Images:
FINDINGS: Patient was given small volume water-soluble contrast by mouth.
Contrast material passes readily through the gastric sleeve, exiting
the stomach through a normal-appearing pylorus. No evidence for
contrast leak at the suture line. No duodenum dilatation although
peristalsis in the duodenum is somewhat sluggish.
IMPRESSION: Expected postoperative appearance in this patient status post
gastric sleeve for section. Specifically, no evidence for gastric
outlet obstruction or contrast leak.

## 2016-12-23 DIAGNOSIS — Z23 Encounter for immunization: Secondary | ICD-10-CM | POA: Diagnosis not present

## 2016-12-23 DIAGNOSIS — Z0001 Encounter for general adult medical examination with abnormal findings: Secondary | ICD-10-CM | POA: Diagnosis not present

## 2017-03-20 DIAGNOSIS — G43009 Migraine without aura, not intractable, without status migrainosus: Secondary | ICD-10-CM | POA: Diagnosis not present

## 2017-03-20 DIAGNOSIS — Z9884 Bariatric surgery status: Secondary | ICD-10-CM | POA: Diagnosis not present

## 2017-04-18 DIAGNOSIS — Z9884 Bariatric surgery status: Secondary | ICD-10-CM | POA: Diagnosis not present

## 2017-07-09 DIAGNOSIS — S53431A Radial collateral ligament sprain of right elbow, initial encounter: Secondary | ICD-10-CM | POA: Diagnosis not present

## 2017-10-23 ENCOUNTER — Ambulatory Visit (INDEPENDENT_AMBULATORY_CARE_PROVIDER_SITE_OTHER): Payer: Self-pay

## 2017-10-23 ENCOUNTER — Encounter (INDEPENDENT_AMBULATORY_CARE_PROVIDER_SITE_OTHER): Payer: Self-pay | Admitting: Orthopaedic Surgery

## 2017-10-23 ENCOUNTER — Telehealth (INDEPENDENT_AMBULATORY_CARE_PROVIDER_SITE_OTHER): Payer: Self-pay | Admitting: Orthopaedic Surgery

## 2017-10-23 ENCOUNTER — Ambulatory Visit (INDEPENDENT_AMBULATORY_CARE_PROVIDER_SITE_OTHER): Payer: 59 | Admitting: Orthopaedic Surgery

## 2017-10-23 DIAGNOSIS — M25532 Pain in left wrist: Secondary | ICD-10-CM

## 2017-10-23 MED ORDER — METHYLPREDNISOLONE 4 MG PO TABS: ORAL_TABLET | ORAL | 0 refills | Status: DC

## 2017-10-23 NOTE — Telephone Encounter (Signed)
Patient needs a 1 week fu with Dr. Magnus IvanBlackman or Bronson CurbGil on 10/30/17.  Thank you.

## 2017-10-23 NOTE — Progress Notes (Signed)
Office Visit Note   Patient: Charles Morton           Date of Birth: Jun 05, 1995           MRN: [id number] Visit Date: 10/23/2017              Requested by: Sigmund Hazel, MD [address] [place], [place] [address] PCP: Sigmund Hazel, MD   Assessment & Plan: Visit Diagnoses:  1. Left wrist pain     Plan: Certainly am concerned about the amount of swelling he has run his wrist and hand.  He has a Velcro wrist splint and encouraged him to use his wrist splint.  I am going to put him on a 6-day steroid taper and he will take ibuprofen as well.  I did give him a work note for light duty work only.  I would like to see him back in a week for repeat exam.  I did not ask him about substance abuse but reviewing his chart he does have a history of substance abuse in the past.  Again I looked at his arm and hand and wrist and saw no open wounds or any lymphedema or streaking to suggest anything related to substance abuse or use.  Certainly with x-ray showing significant ulnar negative variance he could have the possibility of synovitis in the wrist with Keinbach's.  I will reevaluate him in a week.  Obviously if things worsening he will let us know.  Follow-Up Instructions: Return in about 1 week (around 10/30/2017).   Orders:  Orders Placed This Encounter  Procedures  . XR Wrist Complete Left   Meds ordered this encounter  Medications  . methylPREDNISolone (MEDROL) 4 MG tablet    Sig: Medrol dose pack. Take as instructed    Dispense:  21 tablet    Refill:  0      Procedures: No procedures performed   Clinical Data: No additional findings.   Subjective: Chief Complaint  Patient presents with  . Left Wrist - Pain  Patient is a very pleasant right-hand-dominant 22 year old who comes in today after a week's worth of wrist swelling and hand pain.  He was lifting furniture about a week ago and did not have any specific accident but then developed pain and swelling after this.  He is  never injured his left wrist before.  Again is right-hand dominant.  He denies any fever and chills and denies any other sicknesses.  The pain is all globally around his wrist some laterally and some medially.  HPI  Review of Systems He currently denies any headache, chest pain, shortness of breath, fever, chills, nausea, vomiting.  Objective: Vital Signs: There were no vitals taken for this visit.  Physical Exam He is alert and oriented x3 and in no acute distress Ortho Exam Examination of his left wrist and hand shows he does have swelling around his wrist and hand but there is no redness.  He has pain with dorsiflexion and palmar flexion at the wrist itself.  There is no open wounds or palpable defects.  He is neurovascularly intact with good palpable pulses in his wrist.  His fingers are well-perfused.  He can make an entire fist and does not appear to be significantly uncomfortable but again there is definitely global pain and swelling. Specialty Comments:  No specialty comments available.  Imaging: Xr Wrist Complete Left  Result Date: 10/23/2017 3 views of the left wrist show no acute findings.  There is an  incidental finding of significant ulnar negative variance.  The lunate does not show any cystic changes.    PMFS History: Patient Active Problem List   Diagnosis Date Noted  . Substance induced mood disorder (HCC)   . Polysubstance abuse (HCC)   . Depression   . Severe recurrent major depression without psychotic features (HCC) 08/04/2015  . Cocaine abuse (HCC) 08/04/2015  . Depression, major, severe recurrence (HCC) 08/04/2015  . Mild obstructive sleep apnea 01/24/2015  . Obesity, Class III, BMI 40-49.9 (morbid obesity) (HCC) 01/24/2015  . Prediabetes 01/24/2015  . Fatty liver disease, nonalcoholic 01/24/2015  . Elevated blood pressure 01/24/2015  . Pilonidal cyst with abscess 07/20/2013   Past Medical History:  Diagnosis Date  . Acne   . ADD (attention deficit  disorder)   . Allergy   . Anxiety   . Asthma   . Diabetes mellitus without complication (HCC)    prediabetic on no meds   . GERD (gastroesophageal reflux disease)   . Hypertension   . Pilonidal cyst   . Sleep apnea    mild sleep apnea and no cpap     History reviewed. No pertinent family history.  Past Surgical History:  Procedure Laterality Date  . LAPAROSCOPIC GASTRIC SLEEVE RESECTION N/A 01/24/2015   Procedure: LAPAROSCOPIC GASTRIC SLEEVE RESECTION;  Surgeon: Gaynelle AduEric [name], MD;  Location: [facility];  Service: General;  Laterality: N/A;  . none    . PILONIDAL CYST EXCISION N/A 09/09/2013   Procedure: CYST EXCISION PILONIDAL EXTENSIVE;  Surgeon: Shelly Rubensteinouglas A Maston Wight, MD;  Location: [facility];  Service: General;  Laterality: N/A;  . UPPER GI ENDOSCOPY  01/24/2015   Procedure: UPPER GI ENDOSCOPY;  Surgeon: Gaynelle AduEric [name], MD;  Location: [facility];  Service: General;;   Social History   Occupational History  . Not on file  Tobacco Use  . Smoking status: Current Every Day Smoker    Packs/day: 0.25    Types: Cigarettes  . Smokeless tobacco: Never Used  Substance and Sexual Activity  . Alcohol use: No  . Drug use: Yes    Types: Cocaine  . Sexual activity: Yes

## 2017-10-24 NOTE — Telephone Encounter (Signed)
We can work him in Wednesday morning instead, noway we can do Thursday the 29th

## 2017-10-29 ENCOUNTER — Encounter (INDEPENDENT_AMBULATORY_CARE_PROVIDER_SITE_OTHER): Payer: Self-pay | Admitting: Orthopaedic Surgery

## 2017-10-29 ENCOUNTER — Ambulatory Visit (INDEPENDENT_AMBULATORY_CARE_PROVIDER_SITE_OTHER): Payer: 59 | Admitting: Orthopaedic Surgery

## 2017-10-29 DIAGNOSIS — M25532 Pain in left wrist: Secondary | ICD-10-CM | POA: Diagnosis not present

## 2017-10-29 NOTE — Progress Notes (Signed)
The patient returns today after saw him a week ago with left wrist and hand pain and swelling.  I put him on a steroid taper but he did not take it because he started feeling better and slowly he is improved over the last week.  He has worn his wrist splint at night and that is helped.  An incidental finding at his last visit showed significant ulnar negative variance and is worried about the possibility of Kienbck's disease however his wrist x-rays showed normal-appearing carpal bones.  He comes the office today saying his pain and swelling it resolved.  Of note he denies any wrist pain prior to this most recent event where he was lifting couch and something having his wrist and elbow swelling.  He said it never hurt before.  On examination of his left wrist today his pain is minimal.  There is no swelling.  His range of motion is full.  This point since he is doing so well he wants a note to release him back to work and so given him a note for that.  If he has any wrist issues in the future he will call us to come see us.  All question concerns were answered and addressed.

## 2017-11-10 DIAGNOSIS — Z0100 Encounter for examination of eyes and vision without abnormal findings: Secondary | ICD-10-CM | POA: Diagnosis not present

## 2017-11-26 DIAGNOSIS — M9903 Segmental and somatic dysfunction of lumbar region: Secondary | ICD-10-CM | POA: Diagnosis not present

## 2017-11-26 DIAGNOSIS — M9905 Segmental and somatic dysfunction of pelvic region: Secondary | ICD-10-CM | POA: Diagnosis not present

## 2017-11-26 DIAGNOSIS — M9902 Segmental and somatic dysfunction of thoracic region: Secondary | ICD-10-CM | POA: Diagnosis not present

## 2018-01-21 DIAGNOSIS — E669 Obesity, unspecified: Secondary | ICD-10-CM | POA: Diagnosis not present

## 2020-01-20 ENCOUNTER — Encounter (HOSPITAL_COMMUNITY): Payer: Self-pay

## 2020-01-20 ENCOUNTER — Emergency Department (HOSPITAL_COMMUNITY)
Admission: EM | Admit: 2020-01-20 | Discharge: 2020-01-21 | Disposition: A | Discharge status: Home / Self Care | Payer: PRIVATE HEALTH INSURANCE | Attending: Emergency Medicine | Admitting: Emergency Medicine

## 2020-01-20 ENCOUNTER — Emergency Department (HOSPITAL_COMMUNITY): Payer: PRIVATE HEALTH INSURANCE

## 2020-01-20 DIAGNOSIS — S0990XA Unspecified injury of head, initial encounter: Secondary | ICD-10-CM | POA: Diagnosis present

## 2020-01-20 DIAGNOSIS — S61411A Laceration without foreign body of right hand, initial encounter: Secondary | ICD-10-CM | POA: Diagnosis not present

## 2020-01-20 DIAGNOSIS — Z23 Encounter for immunization: Secondary | ICD-10-CM | POA: Insufficient documentation

## 2020-01-20 DIAGNOSIS — T07XXXA Unspecified multiple injuries, initial encounter: Secondary | ICD-10-CM

## 2020-01-20 DIAGNOSIS — I10 Essential (primary) hypertension: Secondary | ICD-10-CM | POA: Insufficient documentation

## 2020-01-20 DIAGNOSIS — J45909 Unspecified asthma, uncomplicated: Secondary | ICD-10-CM | POA: Diagnosis not present

## 2020-01-20 DIAGNOSIS — F17210 Nicotine dependence, cigarettes, uncomplicated: Secondary | ICD-10-CM | POA: Insufficient documentation

## 2020-01-20 DIAGNOSIS — S0081XA Abrasion of other part of head, initial encounter: Secondary | ICD-10-CM | POA: Diagnosis not present

## 2020-01-20 DIAGNOSIS — E119 Type 2 diabetes mellitus without complications: Secondary | ICD-10-CM | POA: Diagnosis not present

## 2020-01-20 LAB — COMPREHENSIVE METABOLIC PANEL
ALT: 33 U/L (ref 0–44)
AST: 28 U/L (ref 15–41)
Albumin: 4.8 g/dL (ref 3.5–5.0)
Alkaline Phosphatase: 44 U/L (ref 38–126)
Anion gap: 14 (ref 5–15)
BUN: 14 mg/dL (ref 6–20)
CO2: 22 mmol/L (ref 22–32)
Calcium: 9.3 mg/dL (ref 8.9–10.3)
Chloride: 105 mmol/L (ref 98–111)
Creatinine, Ser: 1.25 mg/dL — ABNORMAL HIGH (ref 0.61–1.24)
GFR, Estimated: >60 mL/min (ref >60)
Glucose, Bld: 123 mg/dL — ABNORMAL HIGH (ref 70–99)
Potassium: 3.5 mmol/L (ref 3.5–5.1)
Sodium: 141 mmol/L (ref 135–145)
Total Bilirubin: 0.9 mg/dL (ref 0.3–1.2)
Total Protein: 7.4 g/dL (ref 6.5–8.1)

## 2020-01-20 LAB — I-STAT CHEM 8, ED
BUN: 15 mg/dL (ref 6–20)
Calcium, Ion: 1.09 mmol/L — ABNORMAL LOW (ref 1.15–1.40)
Chloride: 105 mmol/L (ref 98–111)
Creatinine, Ser: 1.3 mg/dL — ABNORMAL HIGH (ref 0.61–1.24)
Glucose, Bld: 117 mg/dL — ABNORMAL HIGH (ref 70–99)
HCT: 44 % (ref 39.0–52.0)
Hemoglobin: 15 g/dL (ref 13.0–17.0)
Potassium: 3.5 mmol/L (ref 3.5–5.1)
Sodium: 141 mmol/L (ref 135–145)
TCO2: 21 mmol/L — ABNORMAL LOW (ref 22–32)

## 2020-01-20 LAB — CBC
HCT: 43.8 % (ref 39.0–52.0)
Hemoglobin: 14.7 g/dL (ref 13.0–17.0)
MCH: 31.1 pg (ref 26.0–34.0)
MCHC: 33.6 g/dL (ref 30.0–36.0)
MCV: 92.8 fL (ref 80.0–100.0)
Platelets: 282 10*3/uL (ref 150–400)
RBC: 4.72 MIL/uL (ref 4.22–5.81)
RDW: 12.4 % (ref 11.5–15.5)
WBC: 10.1 10*3/uL (ref 4.0–10.5)
nRBC: 0 % (ref 0.0–0.2)

## 2020-01-20 LAB — PROTIME-INR
INR: 1.1 (ref 0.8–1.2)
Prothrombin Time: 13.6 seconds (ref 11.4–15.2)

## 2020-01-20 LAB — SAMPLE TO BLOOD BANK

## 2020-01-20 LAB — LACTIC ACID, PLASMA: Lactic Acid, Venous: 2.2 mmol/L (ref 0.5–1.9)

## 2020-01-20 LAB — ETHANOL: Alcohol, Ethyl (B): 109 mg/dL — ABNORMAL HIGH (ref <10)

## 2020-01-20 MED ORDER — KETOROLAC TROMETHAMINE 15 MG/ML IJ SOLN
15.0000 mg | Freq: Once | INTRAMUSCULAR | Status: AC
  Administered 2020-01-21: 15 mg via INTRAVENOUS
  Filled 2020-01-20: qty 1

## 2020-01-20 MED ORDER — LACTATED RINGERS IV BOLUS
1000.0000 mL | Freq: Once | INTRAVENOUS | Status: AC
  Administered 2020-01-20: 1000 mL via INTRAVENOUS

## 2020-01-20 MED ORDER — LIDOCAINE HCL (PF) 1 % IJ SOLN: 10.0000 mL | Freq: Once | INTRAMUSCULAR | Status: DC

## 2020-01-20 MED ORDER — TETANUS-DIPHTH-ACELL PERTUSSIS 5-2.5-18.5 LF-MCG/0.5 IM SUSY
0.5000 mL | PREFILLED_SYRINGE | Freq: Once | INTRAMUSCULAR | Status: AC
  Administered 2020-01-20: 0.5 mL via INTRAMUSCULAR
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
## 2020-01-20 NOTE — Discharge Instructions (Signed)
Please go to your Primary Care Physician, an Urgent Care or return to the Emergency Department to have your staples or sutures removed 14 days from today.  Watch out for signs of infection including worsening pain, redness, swelling, drainage, and/or fever.  Take Tylenol (1000 mg every 6 hours with a max dose of 4000 mg a day) and/or Advil (400 mg every 6 hours) for pain.  You may have some muscle aches which is expected and can use heat/cold, stretching, and/or massage for pain.
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
## 2020-01-20 NOTE — ED Notes (Signed)
TRN note:  Numbing injected to lacerations on R palm by MD, areas copiously irrigated with both sterile water wound cleanser. Wound explored with qtips for foreign bodies. Pt tolerated well, girlfriend at bedside.   Gl[place] Lighter TRN [id number]

## 2020-07-11 ENCOUNTER — Ambulatory Visit: Payer: PRIVATE HEALTH INSURANCE | Admitting: Orthopaedic Surgery

## 2020-12-18 ENCOUNTER — Ambulatory Visit: Payer: Self-pay

## 2020-12-18 ENCOUNTER — Ambulatory Visit: Payer: Commercial Managed Care - PPO | Admitting: Orthopaedic Surgery

## 2020-12-18 DIAGNOSIS — G8929 Other chronic pain: Secondary | ICD-10-CM | POA: Diagnosis not present

## 2020-12-18 DIAGNOSIS — M5442 Lumbago with sciatica, left side: Secondary | ICD-10-CM | POA: Diagnosis not present

## 2020-12-18 DIAGNOSIS — M545 Low back pain, unspecified: Secondary | ICD-10-CM | POA: Diagnosis not present

## 2020-12-18 MED ORDER — METHYLPREDNISOLONE 4 MG PO TABS: ORAL_TABLET | ORAL | 0 refills | Status: DC

## 2020-12-18 MED ORDER — METHOCARBAMOL 750 MG PO TABS: 750.0000 mg | ORAL_TABLET | Freq: Three times a day (TID) | ORAL | 1 refills | Status: AC | PRN

## 2020-12-18 NOTE — Progress Notes (Signed)
Office Visit Note   Patient: Charles Morton           Date of Birth: September 29, 1995           MRN: [id number] Visit Date: 12/18/2020              Requested by: Sigmund Hazel, MD [address] [place],  [place] [address] PCP: Sigmund Hazel, MD   Assessment & Plan: Visit Diagnoses:  1. Low back pain with left-sided sciatica, unspecified back pain laterality, unspecified chronicity   2. Chronic bilateral low back pain without sciatica   3. Chronic bilateral low back pain with left-sided sciatica     Plan: Given the significance of his sciatica combined with his positive straight leg raise on the left side and the numbness and tingling both his feet, a MRI of this point is warranted of the lumbar spine to rule out nerve compression.  I will put him on a 6-day steroid taper and a muscle relaxant as well which will be Robaxin 750 mg.  He was to work on activity modification and limit his lifting.  We will see him back once we have the MRI of his lumbar spine.  All questions and concerns were answered and addressed.  Follow-Up Instructions: No follow-ups on file.   Orders:  Orders Placed This Encounter  Procedures   XR Lumbar Spine 2-3 Views   Meds ordered this encounter  Medications   methylPREDNISolone (MEDROL) 4 MG tablet    Sig: Medrol dose pack. Take as instructed    Dispense:  21 tablet    Refill:  0   methocarbamol (ROBAXIN) 750 MG tablet    Sig: Take 1 tablet (750 mg total) by mouth every 8 (eight) hours as needed for muscle spasms.    Dispense:  60 tablet    Refill:  1      Procedures: No procedures performed   Clinical Data: No additional findings.   Subjective: Chief Complaint  Patient presents with   Lower Back - Pain  The patient is someone I am seeing for the first time.  He comes in with low back pain is been going on and off since the beginning of this year.  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  He has been to a [place]  as well.  At one point he weighed well over 300 pounds and he has had gastric sleeve surgery.  He was down to 250 but has gained some weight more recently.  He has been getting some numbness and tingling in both his feet and he is not a diabetic.  He reports sciatic symptoms more on the right than the left but he has had a bilaterally.  He denies any pain in the groin on either side.  He has never had any back surgery before.  He has been on anti-inflammatories and that has not helped.  He has tried to work on modifying how he lifts things and his work has work with him when he does get flareups of these back issues.  HPI  Review of Systems There is currently listed no headache, chest pain, shortness of breath, fever, chills, nausea, vomiting  Objective: Vital Signs: There were no vitals taken for this visit.  Physical Exam He is alert and orient x3 and in no acute distress Ortho Exam On exam today he does have good flexion extension of his lumbar spine but extension is more painful to him.  He has a grossly positive straight leg raise on the left side with severe pain on the right lower back and sciatic region with his straight leg raise on the left side.  He has subjective numbness in L5 distribution of both feet.  He has no weakness in his legs. Specialty Comments:  No specialty comments available.  Imaging: XR Lumbar Spine 2-3 Views  Result Date: 12/18/2020 X-rays of the lumbar spine with 2 views are difficult to interpret due to the patient's body habitus and difficult with penetration.  The lower aspect of the lumbar spine is just not seen well.    PMFS History: Patient Active Problem List   Diagnosis Date Noted   Substance induced mood disorder (HCC)    Polysubstance abuse (HCC)    Depression    Severe recurrent major depression without psychotic features (HCC) 08/04/2015   Cocaine abuse (HCC) 08/04/2015   Depression, major, severe recurrence (HCC) 08/04/2015   Mild  obstructive sleep apnea 01/24/2015   Obesity, Class III, BMI 40-49.9 (morbid obesity) (HCC) 01/24/2015   Prediabetes 01/24/2015   Fatty liver disease, nonalcoholic 01/24/2015   Elevated blood pressure 01/24/2015   Pilonidal cyst with abscess 07/20/2013   Past Medical History:  Diagnosis Date   Acne    ADD (attention deficit disorder)    Allergy    Anxiety    Asthma    Diabetes mellitus without complication (HCC)    prediabetic on no meds    GERD (gastroesophageal reflux disease)    Hypertension    Pilonidal cyst    Sleep apnea    mild sleep apnea and no cpap     No family history on file.  Past Surgical History:  Procedure Laterality Date   LAPAROSCOPIC GASTRIC SLEEVE RESECTION N/A 01/24/2015   Procedure: LAPAROSCOPIC GASTRIC SLEEVE RESECTION;  Surgeon: Gaynelle Adu, MD;  Location: [facility];  Service: General;  Laterality: N/A;   none     PILONIDAL CYST EXCISION N/A 09/09/2013   Procedure: CYST EXCISION PILONIDAL EXTENSIVE;  Surgeon: Shelly Rubenstein, MD;  Location: [facility];  Service: General;  Laterality: N/A;   UPPER GI ENDOSCOPY  01/24/2015   Procedure: UPPER GI ENDOSCOPY;  Surgeon: Gaynelle Adu, MD;  Location: [facility];  Service: General;;   Social History   Occupational History   Not on file  Tobacco Use   Smoking status: Every Day    Packs/day: 0.25    Types: Cigarettes   Smokeless tobacco: Never  Substance and Sexual Activity   Alcohol use: No   Drug use: Yes    Types: Cocaine   Sexual activity: Yes

## 2020-12-19 ENCOUNTER — Other Ambulatory Visit: Payer: Self-pay

## 2020-12-19 DIAGNOSIS — M4807 Spinal stenosis, lumbosacral region: Secondary | ICD-10-CM

## 2021-01-16 ENCOUNTER — Encounter: Payer: Self-pay | Admitting: *Deleted

## 2021-09-02 ENCOUNTER — Ambulatory Visit (HOSPITAL_COMMUNITY)
Admission: EM | Admit: 2021-09-02 | Discharge: 2021-09-02 | Disposition: A | Discharge status: Home / Self Care | Payer: No Payment, Other | Attending: Urology | Admitting: Urology

## 2021-09-02 DIAGNOSIS — F1990 Other psychoactive substance use, unspecified, uncomplicated: Secondary | ICD-10-CM

## 2021-09-02 DIAGNOSIS — F1910 Other psychoactive substance abuse, uncomplicated: Secondary | ICD-10-CM | POA: Insufficient documentation

## 2021-09-02 DIAGNOSIS — F411 Generalized anxiety disorder: Secondary | ICD-10-CM | POA: Insufficient documentation

## 2021-09-02 NOTE — ED Provider Notes (Signed)
[facility] Medical Screening Exam  Patient Name: Charles Morton MRN: [id number] Date of Evaluation: 09/02/21 Chief Complaint:   Diagnosis:  Final diagnoses:  GAD (generalized anxiety disorder)  Substance use disorder    History of Present illness: Charles Morton is a 26 y.o. adult with psychiatric anxiety, depression, and substance abuse.  Patient presented voluntarily for a walk-in assessment.  This nurse petitioner reviewed patient's chart and met with him face-to-face.  On evaluation, patient is sitting calmly in assessment room in no apparent distress.  He is alert and oriented x4.  He is speaking in clear tone of voice at moderate rate with good eye contact.  Patient's mood is euthymic with congruent affect.  No evidence of mania, distractibility, preoccupation, or delusional thought content noted during assessment.  Patient was cooperative throughout assessment.  Patient states that he has a history of alcohol and cocaine abuse. He says he started drinking alcohol at age 60 and started using cocaine approximately 3 months ago. He reports using 0.5 to 1 gram of cocaine 2 days per week (mainly on the weekends) and consuming 7-8 bottles of "twisted teas- alcoholic beverages" on the weekends. He says substance use is negatively affecting his life at home and ability to be productive at work. He says he wants to enroll in substance abuse treatment program at [facility] but was ask to present here at [facility] for an assessment prior to [facility] accepting him into their program. He denies past hx of withdrawal symptoms. He denies alcohol withdrawal seizures or DTs. He denies depressive symptoms and says he is complaint with his medication. He reports he is prescribed Wellbutrin (he is unsure of dosage) and Xanax $RemoveB'1mg'iBjTmzKx$ /day. He denies SI, HI, AVH, and paranoia. He says he is able to contract for safety.   Psychiatric Specialty Exam  Presentation  General Appearance:Appropriate for  Environment  Eye Contact:Good  Speech:Clear and Coherent  Speech Volume:Normal  Handedness:Right   Mood and Affect  Mood:Euthymic  Affect:Congruent   Thought Process  Thought Processes:Coherent  Descriptions of Associations:Intact  Orientation:Full (Time, Place and Person)  Thought Content:WDL    Hallucinations:None  Ideas of Reference:None  Suicidal Thoughts:No  Homicidal Thoughts:No   Sensorium  Memory:Immediate Good; Recent Good; Remote Good  Judgment:Good  Insight:Good   Executive Functions  Concentration:Good  Attention Span:Good  [place]  Language:Good   Psychomotor Activity  Psychomotor Activity:Normal   Assets  Assets:Communication Skills; Desire for Improvement; Physical Health; Housing; Social Support   Sleep  Sleep:Fair  Number of hours: No data recorded  No data recorded  Physical Exam: Physical Exam Vitals and nursing note reviewed.  Constitutional:      General: She is not in acute distress.    Appearance: Normal appearance. She is well-developed. She is obese. She is not ill-appearing, toxic-appearing or diaphoretic.  HENT:     Head: Normocephalic and atraumatic.  Eyes:     Conjunctiva/sclera: Conjunctivae normal.  Cardiovascular:     Rate and Rhythm: Normal rate.  Pulmonary:     Effort: Pulmonary effort is normal. No respiratory distress.     Breath sounds: Normal breath sounds.  Abdominal:     Palpations: Abdomen is soft.     Tenderness: There is no abdominal tenderness.  Musculoskeletal:        General: No swelling.     Cervical back: Neck supple.  Skin:    Capillary Refill: Capillary refill takes less than 2 seconds.     Coloration: Skin  is not pale.  Neurological:     Mental Status: She is alert and oriented to person, place, and time.  Psychiatric:        Attention and Perception: Attention and perception normal.        Mood and Affect: Mood normal.        Speech: Speech  normal.        Behavior: Behavior normal. Behavior is cooperative.        Thought Content: Thought content normal.        Cognition and Memory: Cognition normal.    Review of Systems  Constitutional: Negative.   HENT: Negative.    Eyes: Negative.   Respiratory: Negative.    Cardiovascular: Negative.   Gastrointestinal: Negative.   Genitourinary: Negative.   Musculoskeletal: Negative.   Skin: Negative.   Neurological: Negative.   Endo/Heme/Allergies: Negative.   Psychiatric/Behavioral:  Positive for substance abuse.    Blood pressure (!) 136/93, pulse (!) 108, temperature 98.9 F (37.2 C), temperature source Oral, resp. rate 20, SpO2 98 %. There is no height or weight on file to calculate BMI.  Musculoskeletal: Strength & Muscle Tone: within normal limits Gait & Station: normal Patient leans: Right   [place] [facility] Discharge Disposition for Follow up and Recommendations: Based on my evaluation the patient does not appear to have an emergency medical condition and can be discharged with resources and follow up care in outpatient services for Medication Management, Substance Abuse Intensive Outpatient Program, and Individual Therapy -outpatient substance abuse resources and therapy information provided to patient.   No evidence of imminent danger to self or others at this time. Patient does not meet criteria for psychiatric admission or IVC. Supportive therapy provided about ongoing stressors. Discussed crisis plan, callling [phone] or going to Emergency Dept   Ophelia Shoulder, NP 09/02/2021, 8:54 PM

## 2021-09-02 NOTE — ED Notes (Signed)
Patient given AVS and verbalizes understanding - patient left of own accord with no sxs of distress - belongings returned and patient escorted to lobby

## 2021-09-02 NOTE — ED Triage Notes (Signed)
Pt presents to [facility] voluntarily, unaccompanied at this time. Pt reports that he has been using alcohol and cocaine for about two months and currently needs substance abuse treatment. Pt states that [facility] requested an assessment and that is why he came to [facility] tonight. Pt states that his alcohol and drug use has caused issues with employment and his family life. Pt reports using cocaine last night and 3 beers (twisted teas). Pt reports he has been using cocaine for a few months, but has had an ongoing issue with drinking most of his life. Pt denies SI, HI, AVH.

## 2021-09-02 NOTE — Discharge Instructions (Addendum)

## 2021-09-06 ENCOUNTER — Telehealth (HOSPITAL_COMMUNITY): Payer: Self-pay | Admitting: Licensed Clinical Social Worker

## 2021-09-06 NOTE — Telephone Encounter (Signed)
Cln returned pt's VM requesting appt at [facility]. Pt stated he is uninsured and lives in [place]. Cln advised that all new pts are required to come in during walk-in hours and that we recommend they arrive by 7:15 am. Pt verbalized understanding and expressed appreciation.

## 2021-12-20 ENCOUNTER — Ambulatory Visit (INDEPENDENT_AMBULATORY_CARE_PROVIDER_SITE_OTHER): Payer: Self-pay | Admitting: Orthopedic Surgery

## 2021-12-20 ENCOUNTER — Ambulatory Visit: Payer: Self-pay

## 2021-12-20 DIAGNOSIS — M5416 Radiculopathy, lumbar region: Secondary | ICD-10-CM

## 2021-12-20 DIAGNOSIS — M4807 Spinal stenosis, lumbosacral region: Secondary | ICD-10-CM
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
## 2022-01-04 ENCOUNTER — Ambulatory Visit: Payer: PRIVATE HEALTH INSURANCE | Admitting: Physical Therapy

## 2022-01-15 ENCOUNTER — Encounter: Payer: Self-pay | Admitting: Orthopedic Surgery

## 2022-01-15 NOTE — Therapy (Addendum)
OUTPATIENT PHYSICAL THERAPY THORACOLUMBAR EVALUATION DISCHARGE SUMMARY   Patient Name: Charles Morton MRN: [id number] DOB:May 16, 1995, 26 y.o., male Today's Date: 01/18/2022   PT End of Session - 01/18/22 1137     Visit Number 1    Number of Visits 4    Date for PT Re-Evaluation 03/15/22    Authorization Type [employer]    PT Start Time 1105    PT Stop Time 1137    PT Time Calculation (min) 32 min    Activity Tolerance Patient tolerated treatment well    Behavior During Therapy WFL for tasks assessed/performed             Past Medical History:  Diagnosis Date   Acne    ADD (attention deficit disorder)    Allergy    Anxiety    Asthma    Diabetes mellitus without complication (HCC)    prediabetic on no meds    GERD (gastroesophageal reflux disease)    Hypertension    Pilonidal cyst    Sleep apnea    mild sleep apnea and no cpap    Past Surgical History:  Procedure Laterality Date   LAPAROSCOPIC GASTRIC SLEEVE RESECTION N/A 01/24/2015   Procedure: LAPAROSCOPIC GASTRIC SLEEVE RESECTION;  Surgeon: Gaynelle Adu, MD;  Location: [facility];  Service: General;  Laterality: N/A;   none     PILONIDAL CYST EXCISION N/A 09/09/2013   Procedure: CYST EXCISION PILONIDAL EXTENSIVE;  Surgeon: Shelly Rubenstein, MD;  Location: [facility];  Service: General;  Laterality: N/A;   UPPER GI ENDOSCOPY  01/24/2015   Procedure: UPPER GI ENDOSCOPY;  Surgeon: Gaynelle Adu, MD;  Location: [facility];  Service: General;;   Patient Active Problem List   Diagnosis Date Noted   Substance induced mood disorder (HCC)    Polysubstance abuse (HCC)    Depression    Severe recurrent major depression without psychotic features (HCC) 08/04/2015   Cocaine abuse (HCC) 08/04/2015   Depression, major, severe recurrence (HCC) 08/04/2015   Mild obstructive sleep apnea 01/24/2015   Obesity, Class III, BMI 40-49.9 (morbid obesity) (HCC) 01/24/2015   Prediabetes 01/24/2015   Fatty liver disease, nonalcoholic 01/24/2015    Elevated blood pressure 01/24/2015   Pilonidal cyst with abscess 07/20/2013    PCP: Sigmund Hazel, MD  REFERRING PROVIDER: London Sheer, MD  REFERRING DIAG: M54.16 (ICD-10-CM) - Radiculopathy, lumbar region  Rationale for Evaluation and Treatment: Rehabilitation  THERAPY DIAG:  Other low back pain - Plan: PT plan of care cert/re-cert  Other symptoms and signs involving the musculoskeletal system - Plan: PT plan of care cert/re-cert  ONSET DATE: chronic x 3 year; exacerbation x 1-2 months  SUBJECTIVE:  SUBJECTIVE STATEMENT: Patient is a 26 y.o. adult with chronic, progressive low back pain.  Patient feels that it is gotten worse within the last couple months.  He does have occasional paresthesias numbness and pain going down the right leg that could be radicular in nature.  Pain is intermittent and intensity can vary at times.  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  A few weeks ago he went to the chiropractor x 5 days and pain improved, but has started to return.  PERTINENT HISTORY:  anxiety, depression, and substance abuse  PAIN:  Are you having pain? Yes: NPRS scale: 1 currently, up to 5, at best 0/10 Pain location: Low back (central), tingling down both legs (not at same time) Pain description: sharp, tingling Aggravating factors: sitting, standing, lifting Relieving factors: lying flat on back  PRECAUTIONS: None  WEIGHT BEARING RESTRICTIONS: No  FALLS:  Has patient fallen in last 6 months? 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)  PLOF:  Independent and Leisure: travel, exercise (walking at park, steam room, lifting at the gym - [employer])  PATIENT GOALS: maintain pain, learn strategies to help  NEXT MD VISIT: 01/30/22  OBJECTIVE:   DIAGNOSTIC FINDINGS:  MRI this afternoon  PATIENT SURVEYS:  01/18/22 FOTO 56 (predicted 71)  SCREENING FOR RED FLAGS: Bowel or bladder incontinence: No Spinal tumors: No  COGNITION: Overall cognitive status: Within functional limits for tasks assessed     SENSATION: WFL  POSTURE: rounded shoulders and forward head  LUMBAR ROM:   AROM eval  Flexion WNL with ERP  Extension WNL  Right lateral flexion WNL  Left lateral flexion WNL  Right rotation WNL  Left rotation WNL   (Blank rows = not tested)  LOWER EXTREMITY MMT:    MMT Right eval Left eval  Hip flexion 5/5 5/5  Hip extension    Hip abduction    Hip adduction    Hip internal rotation    Hip external rotation    Knee flexion 5/5 5/5  Knee extension 5/5 5/5  Ankle dorsiflexion 5/5 5/5  Ankle plantarflexion    Ankle inversion    Ankle eversion     (Blank rows = not tested)  LUMBAR SPECIAL TESTS:  01/18/22 Slump test: Positive (on left)  GAIT: 01/18/22 Comments: independent with amb, no deficits noted  TODAY'S TREATMENT:                                                                                                                              DATE: 01/18/22 TherEx See HEP - reviewed with pt with trial reps PRN, discussed current gym routine for core/back strengthening and low back/hip exercises are limited so plan to incorporate more of those into gym  routine over next few sessions    PATIENT EDUCATION:  Education details: HEP Person educated: Patient Education method: Explanation, Demonstration, and Handouts Education comprehension: verbalized understanding and needs further education  HOME EXERCISE PROGRAM: Access Code: [email or web address] URL: [email or web address] Date: 01/18/2022 Prepared by:  Moshe Cipro  Exercises - Standing Lumbar Extension  - 3-5 x daily - 7 x weekly - 1 sets - 10 reps - 10 hold - Hip Neural Slide/Glide  - 2 x daily - 7 x weekly - 1 sets - 5-10 reps - 5-10 sec hold - Knee Nerve Slide/Glide  - 2 x daily - 7 x weekly - 1 sets - 5-10 reps - 5-10  sec hold - Ankle Slide/Glide  - 2 x daily - 7 x weekly - 1 sets - 5-10 reps - 5-10 sec hold - Prone Alternating Arm and Leg Lifts  - 2 x daily - 7 x weekly - 1-2 sets - 10 reps - 5-10 sec hold  ASSESSMENT:  CLINICAL IMPRESSION: Patient is a 26 y.o. male who was seen today for physical therapy evaluation and treatment for LBP.  At this time symptoms have largely subsided with mild positive Lt slump test today.  He demonstrates positive neural tension and continued pain affecting functional mobility.  He will benefit from PT to address deficits listed.   OBJECTIVE IMPAIRMENTS: decreased strength, increased fascial restrictions, impaired sensation, postural dysfunction, and pain.   ACTIVITY LIMITATIONS: carrying, lifting, bending, sitting, standing, squatting, locomotion level, and caring for others  PARTICIPATION LIMITATIONS: meal prep, cleaning, laundry, driving, community activity, occupation, and yard work  PERSONAL FACTORS: 3+ comorbidities: anxiety, depression, and substance abuse  are also affecting patient's functional outcome.   REHAB POTENTIAL: Good  CLINICAL DECISION MAKING: Stable/uncomplicated  EVALUATION COMPLEXITY: Low   GOALS:  Goals reviewed with patient? Yes  SHORT TERM GOALS: Target date: 02/15/2022  Independent with initial HEP Goal status: INITIAL  2.  Demonstrate negative Lt slump test for improved function Goal status: INITIAL   LONG TERM GOALS: Target date: 03/15/2022  Independent with final HEP including gym program Goal status: INITIAL  2.  FOTO score improved to 71 Goal status: INITIAL  3.  Report pain < 2/10 with daily work activities for improved function Goal  status: INITIAL   PLAN:  PT FREQUENCY:  1 visit every 2-3 weeks  PT DURATION: 8 weeks  PLANNED INTERVENTIONS: Therapeutic exercises, Therapeutic activity, Patient/Family education, Self Care, Joint mobilization, Dry Needling, Electrical stimulation, Spinal manipulation, Spinal mobilization, Cryotherapy, Moist heat, Taping, Traction, Manual therapy, and Re-evaluation.  PLAN FOR NEXT SESSION: review HEP and progress low back/hip exercises for gym (bridge single leg, supermans, deadlifts, etc)   Clarita Crane, PT, DPT 01/18/22 11:55 AM     PHYSICAL THERAPY DISCHARGE SUMMARY  Visits from Start of Care: 1  Current functional level related to goals / functional outcomes: See above   Remaining deficits: See above   Education / Equipment: HEP   Patient agrees to discharge. Patient goals were not met. Patient is being discharged due to not returning since the last visit.  Clarita Crane, PT, DPT 03/14/22 12:09 PM  [facility] [address] [place], [place], [address] Phone: [phone]   Fax:  [phone]

## 2022-01-18 ENCOUNTER — Ambulatory Visit
Admission: RE | Admit: 2022-01-18 | Discharge: 2022-01-18 | Disposition: A | Discharge status: Home / Self Care | Payer: PRIVATE HEALTH INSURANCE | Source: Ambulatory Visit | Attending: Orthopedic Surgery | Admitting: Orthopedic Surgery

## 2022-01-18 ENCOUNTER — Encounter: Payer: Self-pay | Admitting: Physical Therapy

## 2022-01-18 ENCOUNTER — Ambulatory Visit (INDEPENDENT_AMBULATORY_CARE_PROVIDER_SITE_OTHER): Payer: Commercial Managed Care - PPO | Admitting: Physical Therapy

## 2022-01-18 ENCOUNTER — Other Ambulatory Visit: Payer: Self-pay

## 2022-01-18 DIAGNOSIS — M5459 Other low back pain: Secondary | ICD-10-CM | POA: Diagnosis not present

## 2022-01-18 DIAGNOSIS — R29898 Other symptoms and signs involving the musculoskeletal system: Secondary | ICD-10-CM

## 2022-01-18 DIAGNOSIS — M5416 Radiculopathy, lumbar region: Secondary | ICD-10-CM

## 2022-01-30 ENCOUNTER — Ambulatory Visit: Payer: PRIVATE HEALTH INSURANCE | Admitting: Orthopedic Surgery

## 2022-02-01 DIAGNOSIS — Z419 Encounter for procedure for purposes other than remedying health state, unspecified: Secondary | ICD-10-CM | POA: Diagnosis not present

## 2022-02-08 ENCOUNTER — Telehealth: Payer: Self-pay | Admitting: Physical Therapy

## 2022-02-08 ENCOUNTER — Encounter: Payer: PRIVATE HEALTH INSURANCE | Admitting: Physical Therapy

## 2022-02-08 NOTE — Therapy (Incomplete)
OUTPATIENT PHYSICAL THERAPY TREATMENT NOTE   Patient Name: Charles Morton MRN: [id number] DOB:03/27/1995, 26 y.o., male Today's Date: 02/08/2022  END OF SESSION:    Past Medical History:  Diagnosis Date   Acne    ADD (attention deficit disorder)    Allergy    Anxiety    Asthma    Diabetes mellitus without complication (HCC)    prediabetic on no meds    GERD (gastroesophageal reflux disease)    Hypertension    Pilonidal cyst    Sleep apnea    mild sleep apnea and no cpap    Past Surgical History:  Procedure Laterality Date   LAPAROSCOPIC GASTRIC SLEEVE RESECTION N/A 01/24/2015   Procedure: LAPAROSCOPIC GASTRIC SLEEVE RESECTION;  Surgeon: Gaynelle Adu, MD;  Location: [facility];  Service: General;  Laterality: N/A;   none     PILONIDAL CYST EXCISION N/A 09/09/2013   Procedure: CYST EXCISION PILONIDAL EXTENSIVE;  Surgeon: Shelly Rubenstein, MD;  Location: [facility];  Service: General;  Laterality: N/A;   UPPER GI ENDOSCOPY  01/24/2015   Procedure: UPPER GI ENDOSCOPY;  Surgeon: Gaynelle Adu, MD;  Location: [facility];  Service: General;;   Patient Active Problem List   Diagnosis Date Noted   Substance induced mood disorder (HCC)    Polysubstance abuse (HCC)    Depression    Severe recurrent major depression without psychotic features (HCC) 08/04/2015   Cocaine abuse (HCC) 08/04/2015   Depression, major, severe recurrence (HCC) 08/04/2015   Mild obstructive sleep apnea 01/24/2015   Obesity, Class III, BMI 40-49.9 (morbid obesity) (HCC) 01/24/2015   Prediabetes 01/24/2015   Fatty liver disease, nonalcoholic 01/24/2015   Elevated blood pressure 01/24/2015   Pilonidal cyst with abscess 07/20/2013     THERAPY DIAG:  No diagnosis found.   PCP: Sigmund Hazel, MD  REFERRING PROVIDER: London Sheer, MD  REFERRING DIAG: [phone] (ICD-10-CM) - Radiculopathy, lumbar region  Rationale for Evaluation and Treatment: Rehabilitation  THERAPY DIAG:  Other low back pain - Plan: PT plan of  care cert/re-cert  Other symptoms and signs involving the musculoskeletal system - Plan: PT plan of care cert/re-cert  ONSET DATE: chronic x 3 year; exacerbation x 1-2 months  SUBJECTIVE:                                                                                                                                                                                           SUBJECTIVE STATEMENT: *** Patient is a 26 y.o. adult with chronic, progressive low back pain.  Patient feels that it is gotten worse within the last couple months.  He does have occasional  paresthesias numbness and pain going down the right leg that could be radicular in nature.  Pain is intermittent and intensity can vary at times.  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  A few weeks ago he went to the chiropractor x 5 days and pain improved, but has started to return.  PERTINENT HISTORY:  anxiety, depression, and substance abuse  PAIN:  Are you having pain? Yes: NPRS scale: 1 currently, up to 5, at best 0/10 Pain location: Low back (central), tingling down both legs (not at same time) Pain description: sharp, tingling Aggravating factors: sitting, standing, lifting Relieving factors: lying flat on back  PRECAUTIONS: None  WEIGHT BEARING RESTRICTIONS: No  FALLS:  Has patient fallen in last 6 months? 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)  PLOF: Independent and Leisure: travel, exercise (walking at park, steam room, lifting at the gym - [employer])  PATIENT GOALS: maintain pain, learn strategies to help  NEXT MD VISIT: 01/30/22  OBJECTIVE:   DIAGNOSTIC FINDINGS:  MRI this afternoon  PATIENT SURVEYS:  01/18/22 FOTO 34 (predicted 71)  SCREENING FOR RED  FLAGS: Bowel or bladder incontinence: No Spinal tumors: No  COGNITION: Overall cognitive status: Within functional limits for tasks assessed     SENSATION: WFL  POSTURE: rounded shoulders and forward head  LUMBAR ROM:   AROM eval  Flexion WNL with ERP  Extension WNL  Right lateral flexion WNL  Left lateral flexion WNL  Right rotation WNL  Left rotation WNL   (Blank rows = not tested)  LOWER EXTREMITY MMT:    MMT Right eval Left eval  Hip flexion 5/5 5/5  Hip extension    Hip abduction    Hip adduction    Hip internal rotation    Hip external rotation    Knee flexion 5/5 5/5  Knee extension 5/5 5/5  Ankle dorsiflexion 5/5 5/5  Ankle plantarflexion    Ankle inversion    Ankle eversion     (Blank rows = not tested)  LUMBAR SPECIAL TESTS:  01/18/22 Slump test: Positive (on left)  GAIT: 01/18/22 Comments: independent with amb, no deficits noted  TODAY'S TREATMENT:                                                                                                                              DATE:  02/08/22 ***  01/18/22 TherEx See HEP - reviewed with pt with trial reps PRN, discussed current gym routine for core/back strengthening and low back/hip exercises are limited so plan to incorporate more of those into gym routine over next few sessions    PATIENT EDUCATION:  Education details: HEP Person educated: Patient Education method: Explanation, Demonstration, and Handouts Education comprehension: verbalized understanding and  needs further education  HOME EXERCISE PROGRAM: Access Code: [email or web address] URL: [email or web address] Date: 01/18/2022 Prepared by: Faustino Congress  Exercises - Standing Lumbar Extension  - 3-5 x daily - 7 x weekly - 1 sets - 10 reps - 10 hold - Hip Neural Slide/Glide  - 2 x daily - 7 x weekly - 1 sets - 5-10 reps - 5-10 sec hold - Knee Nerve Slide/Glide  - 2 x daily - 7 x weekly - 1 sets - 5-10 reps - 5-10  sec hold -  Ankle Slide/Glide  - 2 x daily - 7 x weekly - 1 sets - 5-10 reps - 5-10 sec hold - Prone Alternating Arm and Leg Lifts  - 2 x daily - 7 x weekly - 1-2 sets - 10 reps - 5-10 sec hold  ASSESSMENT:  CLINICAL IMPRESSION: *** Patient is a 26 y.o. male who was seen today for physical therapy evaluation and treatment for LBP.  At this time symptoms have largely subsided with mild positive Lt slump test today.  He demonstrates positive neural tension and continued pain affecting functional mobility.  He will benefit from PT to address deficits listed.   OBJECTIVE IMPAIRMENTS: decreased strength, increased fascial restrictions, impaired sensation, postural dysfunction, and pain.   ACTIVITY LIMITATIONS: carrying, lifting, bending, sitting, standing, squatting, locomotion level, and caring for others  PARTICIPATION LIMITATIONS: meal prep, cleaning, laundry, driving, community activity, occupation, and yard work  PERSONAL FACTORS: 3+ comorbidities: anxiety, depression, and substance abuse are also affecting patient's functional outcome.   REHAB POTENTIAL: Good  CLINICAL DECISION MAKING: Stable/uncomplicated  EVALUATION COMPLEXITY: Low   GOALS:  Goals reviewed with patient? Yes  SHORT TERM GOALS: Target date: 02/15/2022  Independent with initial HEP Goal status: INITIAL  2.  Demonstrate negative Lt slump test for improved function Goal status: INITIAL   LONG TERM GOALS: Target date: 03/15/2022  Independent with final HEP including gym program Goal status: INITIAL  2.  FOTO score improved to 71 Goal status: INITIAL  3.  Report pain < 2/10 with daily work activities for improved function Goal status: INITIAL   PLAN:  PT FREQUENCY: 1 visit every 2-3 weeks  PT DURATION: 8 weeks  PLANNED INTERVENTIONS: Therapeutic exercises, Therapeutic activity, Patient/Family education, Self Care, Joint mobilization, Dry Needling, Electrical stimulation, Spinal manipulation, Spinal mobilization,  Cryotherapy, Moist heat, Taping, Traction, Manual therapy, and Re-evaluation.  PLAN FOR NEXT SESSION: *** review HEP and progress low back/hip exercises for gym (bridge single leg, supermans, deadlifts, etc)    Faustino Congress, PT 02/08/2022, 7:45 AM

## 2022-02-08 NOTE — Telephone Encounter (Signed)
LVM for pt as he did not show for his PT appt.  Advised of no show policy and reminded of next appointment.    Clarita Crane, PT, DPT 02/08/22 11:23 AM

## 2022-02-11 ENCOUNTER — Telehealth: Payer: Self-pay | Admitting: Physical Medicine and Rehabilitation

## 2022-02-11 ENCOUNTER — Ambulatory Visit (INDEPENDENT_AMBULATORY_CARE_PROVIDER_SITE_OTHER): Payer: Commercial Managed Care - PPO | Admitting: Orthopedic Surgery

## 2022-02-11 DIAGNOSIS — M5416 Radiculopathy, lumbar region: Secondary | ICD-10-CM | POA: Diagnosis not present

## 2022-02-11 NOTE — Progress Notes (Signed)
Orthopedic Spine Surgery Office Note  Assessment: Patient is a 26 y.o. male with chronic progressive, low back pain that has acutely worsened. Has radicular pain down the posterior aspect of the left leg. L5/S1 left sided paracentral disc herniation on MRI.    Plan: -Explained that initially conservative treatment is tried as a significant number of patients may experience relief with these treatment modalities. Discussed that the conservative treatments include:  -activity modification  -physical therapy  -over the counter pain medications  -medrol dosepak  -lumbar steroid injections -Patient has tried NSAIDs, Tylenol, chiropractor  -Recommended lumbar transforaminal injection to left L5/S1. He already said he is setting an appt with Dr. Alvester Morin so he should keep working on that -I provided him with a 60mg  shot of toradol today to help with some of the pain -Patient should return to office in 12 weeks, repeat x-rays of lumbar spine at next visit: none   Patient expressed understanding of the plan and all questions were answered to the patient's satisfaction.   Injection note: After discussing the risks, benefits, and alternatives of a intramuscular Toradol injection, patient elected to proceed with the injection.  The left gluteus muscle was sterilized with an alcohol based prep.  The area was anesthetized with ethyl chloride.  A 60 mg intramuscular injection of Toradol was performed of the left gluteus muscle under standard sterile technique.  Needle was withdrawn and a Band-Aid was applied over the area.  Patient tolerated the injection well.   ___________________________________________________________________________  History: Patient is a 26 y.o. male who has been previously seen in the office for symptoms consistent with lumbar radiculopathy. Since the last visit, symptom intensity has become more severe.  Feels that his back pain has become more severe.  His pain going down the left  posterior aspect of the leg is about the same as last time.  He still gets numbness and tingling in that same distribution on the left thigh.  The pain, numbness, tingling does not radiate past the knee on the left side.  Still no symptoms on the right lower extremity.  Feels that his leg pain is worse if he flexes his lumbar spine but his back pain worsens if he extends his lumbar spine.  States that the pain is about 50% leg and 50% in the lower back.  Previous treatments: Chiropractor, NSAIDs, activity modification, Tylenol  COPY OF FIRST NOTE Patient is a 26 y.o. adult who presents today for lumbar spine.  Patient has had pain for about 2 years now.  He states that it started after his girlfriend tried to pop his back.  Pain has been felt in the lower back.  It sometimes radiates out to the paraspinal muscles in the same region.  The pain occasionally radiates down the posterior aspect of his right leg.  It goes down the posterior thigh but does not extend past the knee.  He sometimes gets paresthesias and numbness in that same distribution.  He feels that the pain is gotten progressively worse within the last couple of months.  It is worse with activity and improves with rest.  He has been seeing a chiropractor which she feels is helpful.  Laying flat helps as well.  He feels that the back pain is getting the point that starting interfere with his work.     Weakness: Denies Symptoms of imbalance: Denies Paresthesias and numbness: Yes, in the posterior right thigh.  No other numbness or paresthesias. Bowel or bladder incontinence: Denies Saddle anesthesia:  Denies END OF COPY  Physical Exam:  General: no acute distress, appears stated age Neurologic: alert, answering questions appropriately, following commands Respiratory: unlabored breathing on room air, symmetric chest rise Psychiatric: appropriate affect, normal cadence to speech   MSK (spine):  -Strength  exam      Left  Right EHL    5/5  5/5 TA    5/5  5/5 GSC    5/5  5/5 Knee extension  5/5  5/5 Hip flexion   5/5  5/5  -Sensory exam    Sensation intact to light touch in L3-S1 nerve distributions of bilateral lower extremities  -Straight leg raise: Negative -Contralateral straight leg raise: Negative -Clonus: no beats bilaterally  Imaging: X-ray of the lumbar spine from 12/20/2021 was previously independently reviewed and interpreted, showing slight decrease in the disc height at L5-S1.  No other significant degenerative changes.  No fracture or dislocation.  No evidence of instability on flexion/extension views.   MRI of the lumbar spine from 01/18/2022 was independently reviewed and interpreted, showing DDD at L5-S1.  There is a left-sided paracentral disc herniation at this level as well.   Patient name: Charles Morton Patient MRN: [id number] Date of visit: 02/11/22

## 2022-02-11 NOTE — Telephone Encounter (Signed)
Patient states he wants injection in his back. Please advise--[phone]

## 2022-02-12 ENCOUNTER — Telehealth: Payer: Self-pay | Admitting: Physical Medicine and Rehabilitation

## 2022-02-12 NOTE — Telephone Encounter (Signed)
Pt called to set an back injection and states he seen dr Alvester Morin about 1 yr ago. I do not see that pt seen dr Alvester Morin on his chart. Please call pt about this matter at [phone].

## 2022-02-13 NOTE — Telephone Encounter (Signed)
Spoke with patient and he stated he received his MRI and wants back injections. He saw Dr. Christell Constant on 02/11/22 and wants to know if a referral can be put in for Dr. Alvester Morin

## 2022-02-13 NOTE — Telephone Encounter (Signed)
See previous encounter

## 2022-02-13 NOTE — Addendum Note (Signed)
Addended by: Willia Craze on: 02/13/2022 12:29 PM   Modules accepted: Orders

## 2022-02-13 NOTE — Telephone Encounter (Signed)
LVM #1 to return call to clinic 

## 2022-02-21 ENCOUNTER — Telehealth: Payer: Self-pay | Admitting: Physical Medicine and Rehabilitation

## 2022-02-21 NOTE — Telephone Encounter (Signed)
Please call pt and set an appt for back injection. Pt phone number is [phone].

## 2022-02-22 ENCOUNTER — Encounter: Payer: PRIVATE HEALTH INSURANCE | Admitting: Physical Therapy

## 2022-02-22 NOTE — Telephone Encounter (Signed)
Spoke with patient and scheduled injection for 03/05/22

## 2022-03-04 DIAGNOSIS — Z419 Encounter for procedure for purposes other than remedying health state, unspecified: Secondary | ICD-10-CM | POA: Diagnosis not present

## 2022-03-05 ENCOUNTER — Ambulatory Visit (INDEPENDENT_AMBULATORY_CARE_PROVIDER_SITE_OTHER): Payer: Commercial Managed Care - PPO | Admitting: Physical Medicine and Rehabilitation

## 2022-03-05 ENCOUNTER — Ambulatory Visit: Payer: Self-pay

## 2022-03-05 VITALS — BP 110/77 | HR 83

## 2022-03-05 DIAGNOSIS — M5416 Radiculopathy, lumbar region: Secondary | ICD-10-CM | POA: Diagnosis not present

## 2022-03-05 MED ORDER — METHYLPREDNISOLONE ACETATE 80 MG/ML IJ SUSP
80.0000 mg | Freq: Once | INTRAMUSCULAR | Status: AC
  Administered 2022-03-05: 80 mg

## 2022-03-05 NOTE — Progress Notes (Signed)
Functional Pain Scale - descriptive words and definitions  Moderate (4)   Constantly aware of pain, can complete ADLs with modification/sleep marginally affected at times/passive distraction is of no use, but active distraction gives some relief. Moderate range order  Average Pain 8   +Driver, -BT, -Dye Allergies.  Lower back pain on left side that radiates to the back of the left knee

## 2022-03-05 NOTE — Procedures (Signed)
Lumbosacral Transforaminal Epidural Steroid Injection - Sub-Pedicular Approach with Fluoroscopic Guidance  Patient: Charles Morton      Date of Birth: 1996/03/01 MRN: [id number] PCP: Kathyrn Lass, MD      Visit Date: 03/05/2022   Universal Protocol:    Date/Time: 03/05/2022  Consent Given By: the patient  Position: PRONE  Additional Comments: Vital signs were monitored before and after the procedure. Patient was prepped and draped in the usual sterile fashion. The correct patient, procedure, and site was verified.   Injection Procedure Details:   Procedure diagnoses: Lumbar radiculopathy [M54.16]    Meds Administered:  Meds ordered this encounter  Medications   methylPREDNISolone acetate (DEPO-MEDROL) injection 80 mg    Laterality: Left  Location/Site: L5 MRI designates lumbarized S1 transitional segment.  Needle:5.0 in., 22 ga.  Short bevel or Quincke spinal needle  Needle Placement: Transforaminal  Findings:    -Comments: Excellent flow of contrast along the nerve, nerve root and into the epidural space.  Procedure Details: After squaring off the end-plates to get a true AP view, the C-arm was positioned so that an oblique view of the foramen as noted above was visualized. The target area is just inferior to the "nose of the scotty dog" or sub pedicular. The soft tissues overlying this structure were infiltrated with 2-3 ml. of 1% Lidocaine without Epinephrine.  The spinal needle was inserted toward the target using a "trajectory" view along the fluoroscope beam.  Under AP and lateral visualization, the needle was advanced so it did not puncture dura and was located close the 6 O'Clock position of the pedical in AP tracterory. Biplanar projections were used to confirm position. Aspiration was confirmed to be negative for CSF and/or blood. A 1-2 ml. volume of Isovue-250 was injected and flow of contrast was noted at each level. Radiographs were obtained for documentation  purposes.   After attaining the desired flow of contrast documented above, a 0.5 to 1.0 ml test dose of 0.25% Marcaine was injected into each respective transforaminal space.  The patient was observed for 90 seconds post injection.  After no sensory deficits were reported, and normal lower extremity motor function was noted,   the above injectate was administered so that equal amounts of the injectate were placed at each foramen (level) into the transforaminal epidural space.   Additional Comments:  No complications occurred Dressing: 2 x 2 sterile gauze and Band-Aid    Post-procedure details: Patient was observed during the procedure. Post-procedure instructions were reviewed.  Patient left the clinic in stable condition.

## 2022-03-05 NOTE — Patient Instructions (Signed)

## 2022-03-05 NOTE — Progress Notes (Signed)
Charles Morton - 27 y.o. male MRN [id number]  Date of birth: [birth year]  Office Visit Note: Visit Date: 03/05/2022 PCP: Kathyrn Lass, MD Referred by: Callie Fielding, MD  Subjective: Chief Complaint  Patient presents with   Lower Back - Pain   HPI:  Charles Morton is a 27 y.o. male who comes in today at the request of Dr. Ileene Rubens for planned Left L5-S1 Lumbar Transforaminal epidural steroid injection with fluoroscopic guidance.  The patient has failed conservative care including home exercise, medications, time and activity modification.  This injection will be diagnostic and hopefully therapeutic.  Please see requesting physician notes for further details and justification.   ROS Otherwise per HPI.  Assessment & Plan: Visit Diagnoses:    ICD-10-CM   1. Lumbar radiculopathy  M54.16 XR C-ARM NO REPORT    Epidural Steroid injection    methylPREDNISolone acetate (DEPO-MEDROL) injection 80 mg      Plan: No additional findings.   Meds & Orders:  Meds ordered this encounter  Medications   methylPREDNISolone acetate (DEPO-MEDROL) injection 80 mg    Orders Placed This Encounter  Procedures   XR C-ARM NO REPORT   Epidural Steroid injection    Follow-up: Return for visit to requesting provider as needed.   Procedures: No procedures performed  Lumbosacral Transforaminal Epidural Steroid Injection - Sub-Pedicular Approach with Fluoroscopic Guidance  Patient: Charles Morton      Date of Birth: 1995-04-05 MRN: [id number] PCP: Kathyrn Lass, MD      Visit Date: 03/05/2022   Universal Protocol:    Date/Time: 03/05/2022  Consent Given By: the patient  Position: PRONE  Additional Comments: Vital signs were monitored before and after the procedure. Patient was prepped and draped in the usual sterile fashion. The correct patient, procedure, and site was verified.   Injection Procedure Details:   Procedure diagnoses: Lumbar radiculopathy [M54.16]    Meds Administered:   Meds ordered this encounter  Medications   methylPREDNISolone acetate (DEPO-MEDROL) injection 80 mg    Laterality: Left  Location/Site: L5 MRI designates lumbarized S1 transitional segment.  Needle:5.0 in., 22 ga.  Short bevel or Quincke spinal needle  Needle Placement: Transforaminal  Findings:    -Comments: Excellent flow of contrast along the nerve, nerve root and into the epidural space.  Procedure Details: After squaring off the end-plates to get a true AP view, the C-arm was positioned so that an oblique view of the foramen as noted above was visualized. The target area is just inferior to the "nose of the scotty dog" or sub pedicular. The soft tissues overlying this structure were infiltrated with 2-3 ml. of 1% Lidocaine without Epinephrine.  The spinal needle was inserted toward the target using a "trajectory" view along the fluoroscope beam.  Under AP and lateral visualization, the needle was advanced so it did not puncture dura and was located close the 6 O'Clock position of the pedical in AP tracterory. Biplanar projections were used to confirm position. Aspiration was confirmed to be negative for CSF and/or blood. A 1-2 ml. volume of Isovue-250 was injected and flow of contrast was noted at each level. Radiographs were obtained for documentation purposes.   After attaining the desired flow of contrast documented above, a 0.5 to 1.0 ml test dose of 0.25% Marcaine was injected into each respective transforaminal space.  The patient was observed for 90 seconds post injection.  After no sensory deficits were reported, and normal lower extremity motor function was noted,  the above injectate was administered so that equal amounts of the injectate were placed at each foramen (level) into the transforaminal epidural space.   Additional Comments:  No complications occurred Dressing: 2 x 2 sterile gauze and Band-Aid    Post-procedure details: Patient was observed during the  procedure. Post-procedure instructions were reviewed.  Patient left the clinic in stable condition.    Clinical History: MRI LUMBAR SPINE WITHOUT CONTRAST   TECHNIQUE: Multiplanar, multisequence MR imaging of the lumbar spine was performed. No intravenous contrast was administered.   COMPARISON:  None Available.   FINDINGS: Segmentation: 5 non rib-bearing lumbar type vertebral bodies arepresent. There is transitional vertebral body with lumbarization of the S1.   Alignment:  Straightening of the lumbar spine.   Vertebrae:  No fracture, evidence of discitis, or bone lesion.   Conus medullaris and cauda equina: Conus extends to the L1 level. Conus and cauda equina appear normal.   Paraspinal and other soft tissues: Negative.   Disc levels:   T12-L1: No significant disc bulge. No neural foraminal stenosis. No central canal stenosis.   L1-L2: No significant disc bulge. No neural foraminal stenosis. No central canal stenosis.   L2-L3: No significant disc bulge. No neural foraminal stenosis. No central canal stenosis.   L3-L4: No significant disc bulge. No neural foraminal stenosis. No central canal stenosis.   L4-L5: No significant disc bulge. No neural foraminal stenosis. No central canal stenosis.   L5-S1: Disc desiccation with broad-based asymmetric disc bulge to the left. Mild right lateral recess stenosis. Moderate left lateral recess stenosis. Left foraminal and extraforaminal stenosis with impingement of the left L5 nerve roots.   IMPRESSION: 1. At L5-S1 there is disc desiccation with broad-based asymmetric disc bulge to the left. Mild right lateral recess stenosis. Moderate left lateral recess stenosis. Left foraminal and extraforaminal disc protrusion with impingement of the left L5 nerve roots.   2. There is a transitional S1 vertebral body with lumbarization of the S1.     Electronically Signed   By: Keane Police D.O.   On: 01/18/2022 23:24      Objective:  VS:  HT:    WT:   BMI:     BP:110/77  HR:83bpm  TEMP: ( )  RESP:  Physical Exam Vitals and nursing note reviewed.  Constitutional:      General: He is not in acute distress.    Appearance: Normal appearance. He is not ill-appearing.  HENT:     Head: Normocephalic and atraumatic.     Right Ear: External ear normal.     Left Ear: External ear normal.     Nose: No congestion.  Eyes:     Extraocular Movements: Extraocular movements intact.  Cardiovascular:     Rate and Rhythm: Normal rate.     Pulses: Normal pulses.  Pulmonary:     Effort: Pulmonary effort is normal. No respiratory distress.  Abdominal:     General: There is no distension.     Palpations: Abdomen is soft.  Musculoskeletal:        General: No tenderness or signs of injury.     Cervical back: Neck supple.     Right lower leg: No edema.     Left lower leg: No edema.     Comments: Patient has good distal strength without clonus.  Skin:    Findings: No erythema or rash.  Neurological:     General: No focal deficit present.     Mental Status: He is alert  and oriented to person, place, and time.     Sensory: No sensory deficit.     Motor: No weakness or abnormal muscle tone.     Coordination: Coordination normal.  Psychiatric:        Mood and Affect: Mood normal.        Behavior: Behavior normal.      Imaging: XR C-ARM NO REPORT  Result Date: 03/05/2022 Please see Notes tab for imaging impression.

## 2022-03-06 ENCOUNTER — Encounter: Payer: Self-pay | Admitting: Physical Medicine and Rehabilitation

## 2022-03-08 ENCOUNTER — Telehealth: Payer: Self-pay | Admitting: Physical Medicine and Rehabilitation

## 2022-03-08 NOTE — Telephone Encounter (Signed)
I spoke with patient directly on telephone this morning. Patient underwent left L5 transforaminal epidural steroid injection injection in our office per Dr. Ileene Rubens on 03/05/2021. He reports some lower back stiffness that has now subsided greatly post injection. I encouraged him to continue with Tylenol as needed.   He also reports new pain to bilateral rib cage area after chiropractic manipulation 2 days ago. Myself and Dr. Ernestina Patches did review fluoroscopic imaging of injection and determined procedure was successful without any complications. We do not feel injection is causing his new bilateral rib pain, this is more than likely musculoskeletal pain from recent chiropractic manipulation. I encouraged patient to contact chiropractor to discuss, patient states he has undergone chiropractic treatments for several months.   He is scheduled to follow up with Dr. Laurance Flatten in 3 months for re-evaluation.

## 2022-03-11 ENCOUNTER — Telehealth: Payer: Self-pay | Admitting: Radiology

## 2022-03-11 NOTE — Telephone Encounter (Signed)
-----   Message from Lorine Bears, NP sent at 03/11/2022  1:56 PM EST ----- Marykay Lex,   Wanted to let you know, we did lumbar injection on 03/05/2021, he is roughly 6 days out now and is already requesting another injection. He also called shortly after injection with bilateral rib pain, I attributed this to his chiropractic manipulation treatments. He really needs to follow up with Dr. Laurance Flatten. Seems he has issues with chronic pain.   Jinny Blossom

## 2022-03-11 NOTE — Telephone Encounter (Signed)
He needs to keep his appt on 03/14/2022 for [place] to examine him again.

## 2022-03-14 ENCOUNTER — Ambulatory Visit (INDEPENDENT_AMBULATORY_CARE_PROVIDER_SITE_OTHER): Payer: Commercial Managed Care - PPO | Admitting: Orthopedic Surgery

## 2022-03-14 DIAGNOSIS — M5416 Radiculopathy, lumbar region: Secondary | ICD-10-CM | POA: Diagnosis not present

## 2022-03-14 NOTE — Progress Notes (Signed)
Orthopedic Spine Surgery Office Note  Assessment: Patient is a 27 y.o. male with L5/S1 left-sided paracentral disc herniation with radiculopathy.  Developed acute onset of thoracic radicular type pain after chiropractic manipulation.   Plan: -Since his pain has resolved and he is in 0 pain right now, I recommended no further treatment.  He can continue to get injections for his lumbar radiculopathy every 3 to 6 months as needed.  In terms of his thoracic radicular type pain, if that returns we will work that up further. -Patient should return to office on an as-needed basis   Patient expressed understanding of the plan and all questions were answered to the patient's satisfaction.   ___________________________________________________________________________  History: Patient is a 27 y.o. male who has been previously seen in the office for symptoms consistent with lumbar radiculopathy. Since the last visit, his pain improved significantly with steroid injection with Dr. Ernestina Patches.  He states that his back pain and radiating left leg pain completely went away.  He then went to the chiropractor shortly after that steroid injection where he underwent a adjustment.  After the adjustment, he noted acute onset of pain radiating from his back into his chest wall.  He felt some both sides.  He was prescribed a Medrol Dosepak and Robaxin and after 3 days that pain resolved.  He is currently having no pain.  He has no paresthesias or numbness.  No change in bowel or bladder habits.  No saddle anesthesia.  Previous treatments: NSAIDs, Tylenol, chiropractor, Medrol Dosepak, Robaxin, steroid injection  COPY OF FIRST NOTE Patient is a 27 y.o. adult who presents today for lumbar spine.  Patient has had pain for about 2 years now.  He states that it started after his girlfriend tried to pop his back.  Pain has been felt in the lower back.  It sometimes radiates out to the paraspinal muscles in the same region.  The  pain occasionally radiates down the posterior aspect of his right leg.  It goes down the posterior thigh but does not extend past the knee.  He sometimes gets paresthesias and numbness in that same distribution.  He feels that the pain is gotten progressively worse within the last couple of months.  It is worse with activity and improves with rest.  He has been seeing a chiropractor which she feels is helpful.  Laying flat helps as well.  He feels that the back pain is getting the point that starting interfere with his work.     Weakness: Denies Symptoms of imbalance: Denies Paresthesias and numbness: Yes, in the posterior right thigh.  No other numbness or paresthesias. Bowel or bladder incontinence: Denies Saddle anesthesia: Denies END OF COPY  Physical Exam:  General: no acute distress, appears stated age Neurologic: alert, answering questions appropriately, following commands Respiratory: unlabored breathing on room air, symmetric chest rise Psychiatric: appropriate affect, normal cadence to speech   MSK (spine):  -Strength exam      Left  Right EHL    5/5  5/5 TA    5/5  5/5 GSC    5/5  5/5 Knee extension  5/5  5/5 Hip flexion   5/5  5/5  -Sensory exam    Sensation intact to light touch in L3-S1 nerve distributions of bilateral lower extremities  -Achilles DTR: 2/4 on the left, 2/4 on the right -Patellar tendon DTR: 2/4 on the left, 2/4 on the right  -Straight leg raise: negative -Contralateral straight leg raise: negative -Clonus: no beats  bilaterally  Imaging: X-ray of the lumbar spine from 12/20/2021 was previously independently reviewed and interpreted, showing slight decrease in the disc height at L5-S1.  No other significant degenerative changes.  No fracture or dislocation.  No evidence of instability on flexion/extension views.    MRI of the lumbar spine from 01/18/2022 was previously independently reviewed and interpreted, showing DDD at L5-S1.  There is a  left-sided paracentral disc herniation at this level as well.   Patient name: Charles Morton Patient MRN: [id number] Date of visit: 03/14/22

## 2022-04-04 DIAGNOSIS — Z419 Encounter for procedure for purposes other than remedying health state, unspecified: Secondary | ICD-10-CM | POA: Diagnosis not present

## 2022-04-08 ENCOUNTER — Telehealth: Payer: Self-pay

## 2022-04-08 DIAGNOSIS — M5416 Radiculopathy, lumbar region: Secondary | ICD-10-CM

## 2022-04-08 NOTE — Telephone Encounter (Signed)
Order for repeat injection placed 

## 2022-04-10 ENCOUNTER — Telehealth: Payer: Self-pay | Admitting: Physical Medicine and Rehabilitation

## 2022-04-10 NOTE — Telephone Encounter (Signed)
Spoke to patient and scheduled for 04/11/22

## 2022-04-10 NOTE — Telephone Encounter (Signed)
Patient called. Returning a call to schedule with Dr. Newton.  ?

## 2022-04-11 ENCOUNTER — Ambulatory Visit (INDEPENDENT_AMBULATORY_CARE_PROVIDER_SITE_OTHER): Payer: Commercial Managed Care - PPO | Admitting: Physical Medicine and Rehabilitation

## 2022-04-11 ENCOUNTER — Ambulatory Visit: Payer: Self-pay

## 2022-04-11 ENCOUNTER — Telehealth: Payer: Self-pay | Admitting: Physical Medicine and Rehabilitation

## 2022-04-11 VITALS — BP 117/74 | HR 75

## 2022-04-11 DIAGNOSIS — M5416 Radiculopathy, lumbar region: Secondary | ICD-10-CM | POA: Diagnosis not present

## 2022-04-11 DIAGNOSIS — M5116 Intervertebral disc disorders with radiculopathy, lumbar region: Secondary | ICD-10-CM

## 2022-04-11 MED ORDER — METHYLPREDNISOLONE ACETATE 80 MG/ML IJ SUSP
80.0000 mg | Freq: Once | INTRAMUSCULAR | Status: AC
  Administered 2022-04-11: 80 mg

## 2022-04-11 NOTE — Patient Instructions (Signed)

## 2022-04-11 NOTE — Telephone Encounter (Signed)
Spoke with patient and informed him of next available appointment. He stated he will still come to appointment today

## 2022-04-11 NOTE — Telephone Encounter (Signed)
Patient wants to know if he can resch his appointment later today or tomorrow. [phone]

## 2022-04-11 NOTE — Progress Notes (Signed)
Functional Pain Scale - descriptive words and definitions  Moderate (4)   Constantly aware of pain, can complete ADLs with modification/sleep marginally affected at times/passive distraction is of no use, but active distraction gives some relief. Moderate range order  Average Pain 8   +Driver, -BT, -Dye Allergies.  Lower back pain on left side with no radiation

## 2022-04-16 NOTE — Procedures (Signed)
Lumbosacral Transforaminal Epidural Steroid Injection - Sub-Pedicular Approach with Fluoroscopic Guidance  Patient: Charles Morton      Date of Birth: Aug 05, 1995 MRN: [id number] PCP: Kathyrn Lass, MD      Visit Date: 04/11/2022   Universal Protocol:    Date/Time: 04/11/2022  Consent Given By: the patient  Position: PRONE  Additional Comments: Vital signs were monitored before and after the procedure. Patient was prepped and draped in the usual sterile fashion. The correct patient, procedure, and site was verified.   Injection Procedure Details:   Procedure diagnoses: Lumbar radiculopathy [M54.16]    Meds Administered:  Meds ordered this encounter  Medications   methylPREDNISolone acetate (DEPO-MEDROL) injection 80 mg    Laterality: Left  Location/Site: L5  Needle:5.0 in., 22 ga.  Short bevel or Quincke spinal needle  Needle Placement: Transforaminal  Findings:    -Comments: Excellent flow of contrast along the nerve, nerve root and into the epidural space.  Procedure Details: After squaring off the end-plates to get a true AP view, the C-arm was positioned so that an oblique view of the foramen as noted above was visualized. The target area is just inferior to the "nose of the scotty dog" or sub pedicular. The soft tissues overlying this structure were infiltrated with 2-3 ml. of 1% Lidocaine without Epinephrine.  The spinal needle was inserted toward the target using a "trajectory" view along the fluoroscope beam.  Under AP and lateral visualization, the needle was advanced so it did not puncture dura and was located close the 6 O'Clock position of the pedical in AP tracterory. Biplanar projections were used to confirm position. Aspiration was confirmed to be negative for CSF and/or blood. A 1-2 ml. volume of Isovue-250 was injected and flow of contrast was noted at each level. Radiographs were obtained for documentation purposes.   After attaining the desired flow of  contrast documented above, a 0.5 to 1.0 ml test dose of 0.25% Marcaine was injected into each respective transforaminal space.  The patient was observed for 90 seconds post injection.  After no sensory deficits were reported, and normal lower extremity motor function was noted,   the above injectate was administered so that equal amounts of the injectate were placed at each foramen (level) into the transforaminal epidural space.   Additional Comments:  No complications occurred Dressing: 2 x 2 sterile gauze and Band-Aid    Post-procedure details: Patient was observed during the procedure. Post-procedure instructions were reviewed.  Patient left the clinic in stable condition.

## 2022-04-16 NOTE — Progress Notes (Signed)
[name] - 27 y.o. male MRN [id number]  Date of birth: 1995/05/10  Office Visit Note: Visit Date: 04/11/2022 PCP: Kathyrn Lass, MD Referred by: Callie Fielding, MD  Subjective: Chief Complaint  Patient presents with   Lower Back - Pain   HPI:  Charles Morton is a 27 y.o. male who comes in today for planned repeat Left L5-S1  Lumbar Transforaminal epidural steroid injection with fluoroscopic guidance.  The patient has failed conservative care including home exercise, medications, time and activity modification.  This injection will be diagnostic and hopefully therapeutic.  Please see requesting physician notes for further details and justification. Patient received more than 50% pain relief from prior injection.   Referring: Dr. Ileene Rubens   ROS Otherwise per HPI.  Assessment & Plan: Visit Diagnoses:    ICD-10-CM   1. Lumbar radiculopathy  M54.16 XR C-ARM NO REPORT    Epidural Steroid injection    methylPREDNISolone acetate (DEPO-MEDROL) injection 80 mg    2. Radiculopathy due to lumbar intervertebral disc disorder  M51.16       Plan: No additional findings.   Meds & Orders:  Meds ordered this encounter  Medications   methylPREDNISolone acetate (DEPO-MEDROL) injection 80 mg    Orders Placed This Encounter  Procedures   XR C-ARM NO REPORT   Epidural Steroid injection    Follow-up: Return for visit to requesting provider as needed.   Procedures: No procedures performed  Lumbosacral Transforaminal Epidural Steroid Injection - Sub-Pedicular Approach with Fluoroscopic Guidance  Patient: Charles Morton      Date of Birth: 06-21-1995 MRN: [id number] PCP: Kathyrn Lass, MD      Visit Date: 04/11/2022   Universal Protocol:    Date/Time: 04/11/2022  Consent Given By: the patient  Position: PRONE  Additional Comments: Vital signs were monitored before and after the procedure. Patient was prepped and draped in the usual sterile fashion. The correct patient,  procedure, and site was verified.   Injection Procedure Details:   Procedure diagnoses: Lumbar radiculopathy [M54.16]    Meds Administered:  Meds ordered this encounter  Medications   methylPREDNISolone acetate (DEPO-MEDROL) injection 80 mg    Laterality: Left  Location/Site: L5  Needle:5.0 in., 22 ga.  Short bevel or Quincke spinal needle  Needle Placement: Transforaminal  Findings:    -Comments: Excellent flow of contrast along the nerve, nerve root and into the epidural space.  Procedure Details: After squaring off the end-plates to get a true AP view, the C-arm was positioned so that an oblique view of the foramen as noted above was visualized. The target area is just inferior to the "nose of the scotty dog" or sub pedicular. The soft tissues overlying this structure were infiltrated with 2-3 ml. of 1% Lidocaine without Epinephrine.  The spinal needle was inserted toward the target using a "trajectory" view along the fluoroscope beam.  Under AP and lateral visualization, the needle was advanced so it did not puncture dura and was located close the 6 O'Clock position of the pedical in AP tracterory. Biplanar projections were used to confirm position. Aspiration was confirmed to be negative for CSF and/or blood. A 1-2 ml. volume of Isovue-250 was injected and flow of contrast was noted at each level. Radiographs were obtained for documentation purposes.   After attaining the desired flow of contrast documented above, a 0.5 to 1.0 ml test dose of 0.25% Marcaine was injected into each respective transforaminal space.  The patient was observed for 90  seconds post injection.  After no sensory deficits were reported, and normal lower extremity motor function was noted,   the above injectate was administered so that equal amounts of the injectate were placed at each foramen (level) into the transforaminal epidural space.   Additional Comments:  No complications occurred Dressing: 2 x 2  sterile gauze and Band-Aid    Post-procedure details: Patient was observed during the procedure. Post-procedure instructions were reviewed.  Patient left the clinic in stable condition.    Clinical History: MRI LUMBAR SPINE WITHOUT CONTRAST   TECHNIQUE: Multiplanar, multisequence MR imaging of the lumbar spine was performed. No intravenous contrast was administered.   COMPARISON:  None Available.   FINDINGS: Segmentation: 5 non rib-bearing lumbar type vertebral bodies arepresent. There is transitional vertebral body with lumbarization of the S1.   Alignment:  Straightening of the lumbar spine.   Vertebrae:  No fracture, evidence of discitis, or bone lesion.   Conus medullaris and cauda equina: Conus extends to the L1 level. Conus and cauda equina appear normal.   Paraspinal and other soft tissues: Negative.   Disc levels:   T12-L1: No significant disc bulge. No neural foraminal stenosis. No central canal stenosis.   L1-L2: No significant disc bulge. No neural foraminal stenosis. No central canal stenosis.   L2-L3: No significant disc bulge. No neural foraminal stenosis. No central canal stenosis.   L3-L4: No significant disc bulge. No neural foraminal stenosis. No central canal stenosis.   L4-L5: No significant disc bulge. No neural foraminal stenosis. No central canal stenosis.   L5-S1: Disc desiccation with broad-based asymmetric disc bulge to the left. Mild right lateral recess stenosis. Moderate left lateral recess stenosis. Left foraminal and extraforaminal stenosis with impingement of the left L5 nerve roots.   IMPRESSION: 1. At L5-S1 there is disc desiccation with broad-based asymmetric disc bulge to the left. Mild right lateral recess stenosis. Moderate left lateral recess stenosis. Left foraminal and extraforaminal disc protrusion with impingement of the left L5 nerve roots.   2. There is a transitional S1 vertebral body with lumbarization of the  S1.     Electronically Signed   By: Keane Police D.O.   On: 01/18/2022 23:24     Objective:  VS:  HT:    WT:   BMI:     BP:117/74  HR:75bpm  TEMP: ( )  RESP:  Physical Exam Vitals and nursing note reviewed.  Constitutional:      General: He is not in acute distress.    Appearance: Normal appearance. He is not ill-appearing.  HENT:     Head: Normocephalic and atraumatic.     Right Ear: External ear normal.     Left Ear: External ear normal.     Nose: No congestion.  Eyes:     Extraocular Movements: Extraocular movements intact.  Cardiovascular:     Rate and Rhythm: Normal rate.     Pulses: Normal pulses.  Pulmonary:     Effort: Pulmonary effort is normal. No respiratory distress.  Abdominal:     General: There is no distension.     Palpations: Abdomen is soft.  Musculoskeletal:        General: No tenderness or signs of injury.     Cervical back: Neck supple.     Right lower leg: No edema.     Left lower leg: No edema.     Comments: Patient has good distal strength without clonus.  Skin:    Findings: No erythema or rash.  Neurological:     General: No focal deficit present.     Mental Status: He is alert and oriented to person, place, and time.     Sensory: No sensory deficit.     Motor: No weakness or abnormal muscle tone.     Coordination: Coordination normal.  Psychiatric:        Mood and Affect: Mood normal.        Behavior: Behavior normal.      Imaging: No results found.

## 2022-05-03 DIAGNOSIS — Z419 Encounter for procedure for purposes other than remedying health state, unspecified: Secondary | ICD-10-CM | POA: Diagnosis not present

## 2022-05-22 ENCOUNTER — Telehealth: Payer: Self-pay

## 2022-05-22 NOTE — Telephone Encounter (Signed)
Mychart msg sent

## 2022-06-03 DIAGNOSIS — Z419 Encounter for procedure for purposes other than remedying health state, unspecified: Secondary | ICD-10-CM | POA: Diagnosis not present

## 2022-07-03 DIAGNOSIS — Z419 Encounter for procedure for purposes other than remedying health state, unspecified: Secondary | ICD-10-CM | POA: Diagnosis not present

## 2022-08-03 DIAGNOSIS — Z419 Encounter for procedure for purposes other than remedying health state, unspecified: Secondary | ICD-10-CM | POA: Diagnosis not present

## 2023-03-06 ENCOUNTER — Encounter: Payer: PRIVATE HEALTH INSURANCE | Admitting: Orthopedic Surgery

## 2023-03-10 ENCOUNTER — Ambulatory Visit: Payer: Commercial Managed Care - PPO | Admitting: Podiatry

## 2024-01-05 ENCOUNTER — Encounter: Payer: Self-pay | Admitting: Radiology
